# Patient Record
Sex: Female | Born: 1945 | Race: White | Hispanic: No | Smoking: Former smoker
Health system: Southern US, Community
[De-identification: ages and names within clinical notes are randomized; demographics above are authoritative.]

## PROBLEM LIST (undated history)

## (undated) DIAGNOSIS — T4145XA Adverse effect of unspecified anesthetic, initial encounter: Secondary | ICD-10-CM

## (undated) DIAGNOSIS — F419 Anxiety disorder, unspecified: Secondary | ICD-10-CM

## (undated) DIAGNOSIS — M8580 Other specified disorders of bone density and structure, unspecified site: Secondary | ICD-10-CM

## (undated) DIAGNOSIS — K649 Unspecified hemorrhoids: Secondary | ICD-10-CM

## (undated) DIAGNOSIS — F99 Mental disorder, not otherwise specified: Secondary | ICD-10-CM

## (undated) DIAGNOSIS — T8859XA Other complications of anesthesia, initial encounter: Secondary | ICD-10-CM

## (undated) DIAGNOSIS — D259 Leiomyoma of uterus, unspecified: Secondary | ICD-10-CM

## (undated) DIAGNOSIS — M1990 Unspecified osteoarthritis, unspecified site: Secondary | ICD-10-CM

## (undated) DIAGNOSIS — R112 Nausea with vomiting, unspecified: Secondary | ICD-10-CM

## (undated) DIAGNOSIS — N289 Disorder of kidney and ureter, unspecified: Secondary | ICD-10-CM

## (undated) DIAGNOSIS — Z9889 Other specified postprocedural states: Secondary | ICD-10-CM

## (undated) DIAGNOSIS — N6019 Diffuse cystic mastopathy of unspecified breast: Secondary | ICD-10-CM

## (undated) HISTORY — DX: Anxiety disorder, unspecified: F41.9

## (undated) HISTORY — DX: Other specified disorders of bone density and structure, unspecified site: M85.80

## (undated) HISTORY — DX: Disorder of kidney and ureter, unspecified: N28.9

## (undated) HISTORY — DX: Diffuse cystic mastopathy of unspecified breast: N60.19

## (undated) HISTORY — DX: Leiomyoma of uterus, unspecified: D25.9

## (undated) HISTORY — PX: TONSILLECTOMY: SUR1361

## (undated) HISTORY — PX: BREAST BIOPSY: SHX20

---

## 1998-02-18 ENCOUNTER — Other Ambulatory Visit: Admission: RE | Admit: 1998-02-18 | Discharge: 1998-02-18 | Payer: Self-pay | Admitting: Family Medicine

## 1998-06-07 ENCOUNTER — Emergency Department (HOSPITAL_COMMUNITY): Admission: EM | Admit: 1998-06-07 | Discharge: 1998-06-07 | Payer: Self-pay | Admitting: Endocrinology

## 2000-08-15 ENCOUNTER — Encounter: Admission: RE | Admit: 2000-08-15 | Discharge: 2000-08-15 | Payer: Self-pay | Admitting: Family Medicine

## 2000-08-15 ENCOUNTER — Encounter: Payer: Self-pay | Admitting: Family Medicine

## 2001-06-05 ENCOUNTER — Other Ambulatory Visit: Admission: RE | Admit: 2001-06-05 | Discharge: 2001-06-05 | Payer: Self-pay | Admitting: Family Medicine

## 2001-08-24 ENCOUNTER — Encounter: Admission: RE | Admit: 2001-08-24 | Discharge: 2001-08-24 | Payer: Self-pay | Admitting: Family Medicine

## 2001-08-24 ENCOUNTER — Encounter: Payer: Self-pay | Admitting: Family Medicine

## 2002-09-13 ENCOUNTER — Encounter: Admission: RE | Admit: 2002-09-13 | Discharge: 2002-09-13 | Payer: Self-pay | Admitting: Family Medicine

## 2002-09-13 ENCOUNTER — Encounter: Payer: Self-pay | Admitting: Family Medicine

## 2002-09-27 ENCOUNTER — Ambulatory Visit (HOSPITAL_COMMUNITY): Admission: RE | Admit: 2002-09-27 | Discharge: 2002-09-27 | Payer: Self-pay | Admitting: Gastroenterology

## 2002-09-28 ENCOUNTER — Encounter: Admission: RE | Admit: 2002-09-28 | Discharge: 2002-09-28 | Payer: Self-pay | Admitting: Family Medicine

## 2002-09-28 ENCOUNTER — Encounter: Payer: Self-pay | Admitting: Family Medicine

## 2002-10-09 ENCOUNTER — Encounter: Payer: Self-pay | Admitting: Nephrology

## 2002-10-09 ENCOUNTER — Encounter: Admission: RE | Admit: 2002-10-09 | Discharge: 2002-10-09 | Payer: Self-pay | Admitting: Nephrology

## 2002-11-30 ENCOUNTER — Encounter: Payer: Self-pay | Admitting: Nephrology

## 2002-11-30 ENCOUNTER — Ambulatory Visit (HOSPITAL_COMMUNITY): Admission: RE | Admit: 2002-11-30 | Discharge: 2002-11-30 | Payer: Self-pay | Admitting: Nephrology

## 2002-12-09 HISTORY — PX: NEPHRECTOMY: SHX65

## 2003-11-28 ENCOUNTER — Encounter: Admission: RE | Admit: 2003-11-28 | Discharge: 2003-11-28 | Payer: Self-pay | Admitting: Family Medicine

## 2004-01-10 ENCOUNTER — Other Ambulatory Visit: Admission: RE | Admit: 2004-01-10 | Discharge: 2004-01-10 | Payer: Self-pay | Admitting: Family Medicine

## 2004-03-16 ENCOUNTER — Ambulatory Visit (HOSPITAL_BASED_OUTPATIENT_CLINIC_OR_DEPARTMENT_OTHER): Admission: RE | Admit: 2004-03-16 | Discharge: 2004-03-16 | Payer: Self-pay | Admitting: Surgery

## 2004-03-16 HISTORY — PX: INCISIONAL HERNIA REPAIR: SHX193

## 2004-10-08 HISTORY — PX: CHEILECTOMY: SHX1336

## 2004-10-28 ENCOUNTER — Ambulatory Visit (HOSPITAL_BASED_OUTPATIENT_CLINIC_OR_DEPARTMENT_OTHER): Admission: RE | Admit: 2004-10-28 | Discharge: 2004-10-28 | Payer: Self-pay | Admitting: Orthopedic Surgery

## 2004-10-28 ENCOUNTER — Encounter (INDEPENDENT_AMBULATORY_CARE_PROVIDER_SITE_OTHER): Payer: Self-pay | Admitting: Specialist

## 2004-10-28 ENCOUNTER — Ambulatory Visit (HOSPITAL_COMMUNITY): Admission: RE | Admit: 2004-10-28 | Discharge: 2004-10-28 | Payer: Self-pay | Admitting: Orthopedic Surgery

## 2004-11-30 ENCOUNTER — Ambulatory Visit (HOSPITAL_COMMUNITY): Admission: RE | Admit: 2004-11-30 | Discharge: 2004-11-30 | Payer: Self-pay | Admitting: Family Medicine

## 2004-12-02 ENCOUNTER — Encounter: Admission: RE | Admit: 2004-12-02 | Discharge: 2004-12-02 | Payer: Self-pay | Admitting: Family Medicine

## 2005-05-13 ENCOUNTER — Other Ambulatory Visit: Admission: RE | Admit: 2005-05-13 | Discharge: 2005-05-13 | Payer: Self-pay | Admitting: Family Medicine

## 2005-12-03 ENCOUNTER — Encounter: Admission: RE | Admit: 2005-12-03 | Discharge: 2005-12-03 | Payer: Self-pay | Admitting: Family Medicine

## 2006-05-10 ENCOUNTER — Other Ambulatory Visit: Admission: RE | Admit: 2006-05-10 | Discharge: 2006-05-10 | Payer: Self-pay | Admitting: Family Medicine

## 2006-09-16 ENCOUNTER — Encounter: Admission: RE | Admit: 2006-09-16 | Discharge: 2006-09-16 | Payer: Self-pay | Admitting: Family Medicine

## 2006-12-21 ENCOUNTER — Ambulatory Visit (HOSPITAL_COMMUNITY): Admission: RE | Admit: 2006-12-21 | Discharge: 2006-12-21 | Payer: Self-pay | Admitting: Family Medicine

## 2007-06-01 ENCOUNTER — Other Ambulatory Visit: Admission: RE | Admit: 2007-06-01 | Discharge: 2007-06-01 | Payer: Self-pay | Admitting: Family Medicine

## 2007-12-25 ENCOUNTER — Ambulatory Visit (HOSPITAL_COMMUNITY): Admission: RE | Admit: 2007-12-25 | Discharge: 2007-12-25 | Payer: Self-pay | Admitting: Family Medicine

## 2008-06-18 ENCOUNTER — Other Ambulatory Visit: Admission: RE | Admit: 2008-06-18 | Discharge: 2008-06-18 | Payer: Self-pay | Admitting: Family Medicine

## 2008-12-25 ENCOUNTER — Ambulatory Visit (HOSPITAL_COMMUNITY): Admission: RE | Admit: 2008-12-25 | Discharge: 2008-12-25 | Payer: Self-pay | Admitting: Family Medicine

## 2009-12-30 ENCOUNTER — Ambulatory Visit (HOSPITAL_COMMUNITY): Admission: RE | Admit: 2009-12-30 | Discharge: 2009-12-30 | Payer: Self-pay | Admitting: Family Medicine

## 2010-08-06 ENCOUNTER — Other Ambulatory Visit: Admission: RE | Admit: 2010-08-06 | Discharge: 2010-08-06 | Payer: Self-pay | Admitting: Family Medicine

## 2010-11-08 HISTORY — PX: INCISIONAL HERNIA REPAIR: SHX193

## 2010-11-08 HISTORY — PX: LIPOSUCTION: SHX10

## 2010-11-26 ENCOUNTER — Other Ambulatory Visit (HOSPITAL_COMMUNITY): Payer: Self-pay | Admitting: Obstetrics and Gynecology

## 2010-11-26 DIAGNOSIS — Z1231 Encounter for screening mammogram for malignant neoplasm of breast: Secondary | ICD-10-CM

## 2010-11-26 DIAGNOSIS — Z0000 Encounter for general adult medical examination without abnormal findings: Secondary | ICD-10-CM

## 2010-11-29 ENCOUNTER — Encounter: Payer: Self-pay | Admitting: Family Medicine

## 2010-12-31 ENCOUNTER — Ambulatory Visit (HOSPITAL_COMMUNITY): Admission: RE | Admit: 2010-12-31 | Payer: Self-pay | Source: Ambulatory Visit

## 2011-01-06 ENCOUNTER — Ambulatory Visit (HOSPITAL_COMMUNITY)
Admission: RE | Admit: 2011-01-06 | Discharge: 2011-01-06 | Disposition: A | Discharge status: Home / Self Care | Payer: BC Managed Care – PPO | Source: Ambulatory Visit | Attending: Obstetrics and Gynecology | Admitting: Obstetrics and Gynecology

## 2011-01-06 DIAGNOSIS — Z1231 Encounter for screening mammogram for malignant neoplasm of breast: Secondary | ICD-10-CM

## 2011-03-01 DIAGNOSIS — K432 Incisional hernia without obstruction or gangrene: Secondary | ICD-10-CM | POA: Insufficient documentation

## 2011-03-26 NOTE — Op Note (Signed)
Bianca Pollard, Bianca Pollard NO.:  [id number]   MEDICAL RECORD NO.:  [id number]          PATIENT TYPE:  AMB   LOCATION:  DSC                          FACILITY:  [facility]   PHYSICIAN:  Leonides Grills, M.D.     DATE OF BIRTH:  [birth year]   DATE OF PROCEDURE:  10/28/2004  DATE OF DISCHARGE:                                 OPERATIVE REPORT   PREOPERATIVE DIAGNOSIS:  Left hallux rigidus.   POSTOPERATIVE DIAGNOSIS:  Left hallux rigidus.   OPERATION PERFORMED:  Left great toe cheilectomy.   SURGEON:  Leonides Grills, M.D.   ASSISTANT:  Lianne Cure, P.A.   ANESTHESIA:  General endotracheal tube with ankle block.   ESTIMATED BLOOD LOSS:  Minimal.   TOURNIQUET TIME:  Approximately half hour.   COMPLICATIONS:  None.   DISPOSITION:  Stable to PR.   INDICATIONS FOR PROCEDURE:  The patient is a 65 year old female who has had  progressive longstanding left great toe pain that was interfering with her  life to the point where she can not do what she wants to do.  She has  consented for the above procedure.  All risks which include infection,  neurovascular injury, stiffness, persistent arthritis, prolonged recovery  and possible future fusion were all explained, questions were encouraged and  answered.   DESCRIPTION OF PROCEDURE:  The patient was brought to the operating room and  ankle block was administered as well as Ancef 1 g IV piggyback.  The left  lower extremity was then prepped and draped in sterile manner over a  proximally placed thigh tourniquet.  Limb was gravity exsanguinated,  tourniquet was elevated to 290 mmHg.  Longitudinal incision was then made  over the great toe MTP joint.  Dissection was carried down through the skin.  Hemostasis was then obtained.  EHL was then retracted out of harm's way.  Longitudinal capsulotomy was then created dorsally.  Spur was then dissected  out both medially and laterally and this was then removed with a sagittal  saw.  Spurs  in both the medial and lateral gutter were removed as well with  a rongeur.  Spurs were also removed from the dorsal aspect base of proximal  phalanx.  Area was copiously irrigated with normal saline.  Bone  wax was applied to bone and exposed bony surfaces.  Area was copiously  irrigated with normal saline.  Capsule was closed with 3-0 Vicryl.  The skin  was closed with 4-0 nylon. Sterile dressing was applied.  Hard sole shoe was  applied.  The patient was stable to the PAR.      Paul   PB/MEDQ  D:  10/28/2004  T:  10/29/2004  Job:  [id number]

## 2011-03-26 NOTE — Op Note (Signed)
   NAME:  Bianca Pollard, Bianca Pollard                      ACCOUNT NO.:  [id number]   MEDICAL RECORD NO.:  [id number]                   PATIENT TYPE:  AMB   LOCATION:  ENDO                                 FACILITY:  [facility]   PHYSICIAN:  James L. Malon Kindle., M.D.          DATE OF BIRTH:  09/27/1946   DATE OF PROCEDURE:  09/27/2002  DATE OF DISCHARGE:                                 OPERATIVE REPORT   PROCEDURE PERFORMED:  Colonoscopy.   ENDOSCOPIST:  Llana Aliment. Edwards, M.D.   MEDICATIONS:  Fentanyl 50 mcg, Versed 5 mg IV.   INSTRUMENT USED:  Pediatric Olympus video colonoscope.   INDICATIONS FOR PROCEDURE:  Colon cancer screening.   DESCRIPTION OF PROCEDURE:  The procedure had been explained to the patient  and consent obtained.  With the patient in the left lateral decubitus  position, the pediatric Olympus video colonoscope was inserted and advanced  under direct visualization.  The prep was excellent and we were able to  reach the cecum without difficulty. The ileocecal valve and appendiceal  orifice were seen.  The scope was withdrawn and the cecum, ascending colon,  hepatic flexure, transverse colon, splenic flexure, descending and sigmoid  colon were seen well upon removal.  No polyps seen and no significant  diverticulosis.  The scope was withdrawn.  The patient tolerated the  procedure well.   ASSESSMENT:  Essentially normal colonoscopy.   PLAN:  Yearly Hemoccults.  Would recommend possibly repeating in 10 years.                                               James L. Malon Kindle., M.D.    Waldron Session  D:  09/27/2002  T:  09/27/2002  Job:  [id number]   cc:   Jethro Bastos, M.D.  [address]  [place]  [place] [address]  Fax: [phone]

## 2011-03-26 NOTE — Op Note (Signed)
Bianca Pollard, Bianca Pollard                      ACCOUNT NO.:  [id number]   MEDICAL RECORD NO.:  [id number]                   PATIENT TYPE:  AMB   LOCATION:  NESC                                 FACILITY:  [facility]   PHYSICIAN:  Currie Paris, M.D.           DATE OF BIRTH:  01-20-1946   DATE OF PROCEDURE:  03/16/2004  DATE OF DISCHARGE:                                 OPERATIVE REPORT   CCS#:  [id number]   PREOPERATIVE DIAGNOSES:  Epigastric incisional hernia.   POSTOPERATIVE DIAGNOSES:  Epigastric incisional hernia.   OPERATION:  Repair of epigastric incisional hernia with mesh.   SURGEON:  Currie Paris, M.D.   ANESTHESIA:  MAC.   CLINICAL COURSE:  This patient is a 65 year old who is status post  laparoscopic kidney donation who developed a small epigastric hernia in a  midline trocar site.  It was reducible and intermittently got fairly large.   DESCRIPTION OF PROCEDURE:  The patient was seen in the holding area and had  no further questions. The operative site was marked and confirmed by the  patient. She was taken to the operating room and given IV sedation. The area  was prepped and draped. Using a combination of 1% Xylocaine with epinephrine  mixed equally with 0.5% plain Marcaine for local and infiltrated the area.  The old scar was opened and subcutaneous tissues divided and the small  hernia sac identified and cleaned off and then reduced. The defect itself  was just under centimeter and just contained some preperitoneal fat. As I  was cleaning up the fat around this, I found a second small defect just  inferior to the first and this one only really measured 2-3 mm but did have  preperitoneal fat protruding through it as well.  I cleaned off the fascia  and then made sure both reduced easily. The small hernia was closed with a  single suture of 2-0 Prolene using a small piece of mesh as a plug to  reinforce the suture. The second defect was then closed with two  sutures of  2-0 Prolene again using a mesh plug. Everything appeared to be dry and there  was no tension closing these defects.   The subcu was closed with some 2-0 Vicryl and skin with a 4-0 Monocryl  subcuticular plus Dermabond.  The patient tolerated the procedure well.  There were no operative complications.  All counts were correct.                                               Currie Paris, M.D.    CJS/MEDQ  D:  03/16/2004  T:  03/16/2004  Job:  [id number]   cc:   Jethro Bastos, M.D.  [address]  [place]  [place] [address]  Fax: [phone]  Dyke Maes, M.D.  [address]  [place]  [place] [address]  Fax: [phone]

## 2011-09-08 ENCOUNTER — Ambulatory Visit
Admission: RE | Admit: 2011-09-08 | Discharge: 2011-09-08 | Disposition: A | Discharge status: Home / Self Care | Payer: BC Managed Care – PPO | Source: Ambulatory Visit | Attending: Family Medicine | Admitting: Family Medicine

## 2011-09-08 ENCOUNTER — Other Ambulatory Visit: Payer: Self-pay | Admitting: Family Medicine

## 2011-11-30 ENCOUNTER — Other Ambulatory Visit (HOSPITAL_COMMUNITY): Payer: Self-pay | Admitting: Family Medicine

## 2011-11-30 DIAGNOSIS — Z1231 Encounter for screening mammogram for malignant neoplasm of breast: Secondary | ICD-10-CM

## 2012-01-18 ENCOUNTER — Ambulatory Visit (HOSPITAL_COMMUNITY): Payer: BC Managed Care – PPO

## 2012-02-08 ENCOUNTER — Ambulatory Visit (HOSPITAL_COMMUNITY): Payer: BC Managed Care – PPO

## 2012-03-01 ENCOUNTER — Ambulatory Visit (HOSPITAL_COMMUNITY): Payer: BC Managed Care – PPO

## 2012-03-16 ENCOUNTER — Ambulatory Visit (HOSPITAL_COMMUNITY)
Admission: RE | Admit: 2012-03-16 | Discharge: 2012-03-16 | Disposition: A | Discharge status: Home / Self Care | Payer: BC Managed Care – PPO | Source: Ambulatory Visit | Attending: Family Medicine | Admitting: Family Medicine

## 2012-03-16 DIAGNOSIS — Z1231 Encounter for screening mammogram for malignant neoplasm of breast: Secondary | ICD-10-CM

## 2012-03-21 ENCOUNTER — Other Ambulatory Visit: Payer: Self-pay | Admitting: Family Medicine

## 2012-03-21 DIAGNOSIS — R928 Other abnormal and inconclusive findings on diagnostic imaging of breast: Secondary | ICD-10-CM

## 2012-03-24 ENCOUNTER — Ambulatory Visit
Admission: RE | Admit: 2012-03-24 | Discharge: 2012-03-24 | Disposition: A | Discharge status: Home / Self Care | Payer: BC Managed Care – PPO | Source: Ambulatory Visit | Attending: Family Medicine | Admitting: Family Medicine

## 2012-03-24 DIAGNOSIS — R928 Other abnormal and inconclusive findings on diagnostic imaging of breast: Secondary | ICD-10-CM

## 2012-08-21 ENCOUNTER — Other Ambulatory Visit: Payer: Self-pay | Admitting: Family Medicine

## 2012-08-21 DIAGNOSIS — N6009 Solitary cyst of unspecified breast: Secondary | ICD-10-CM

## 2012-09-13 ENCOUNTER — Ambulatory Visit
Admission: RE | Admit: 2012-09-13 | Discharge: 2012-09-13 | Disposition: A | Discharge status: Home / Self Care | Payer: BC Managed Care – PPO | Source: Ambulatory Visit | Attending: Family Medicine | Admitting: Family Medicine

## 2012-09-13 DIAGNOSIS — N6009 Solitary cyst of unspecified breast: Secondary | ICD-10-CM

## 2013-02-22 ENCOUNTER — Encounter (INDEPENDENT_AMBULATORY_CARE_PROVIDER_SITE_OTHER): Payer: Self-pay | Admitting: General Surgery

## 2013-02-26 ENCOUNTER — Ambulatory Visit
Admission: RE | Admit: 2013-02-26 | Discharge: 2013-02-26 | Disposition: A | Discharge status: Home / Self Care | Payer: Medicare Other | Source: Ambulatory Visit | Attending: Family Medicine | Admitting: Family Medicine

## 2013-02-26 ENCOUNTER — Other Ambulatory Visit: Payer: Self-pay | Admitting: Family Medicine

## 2013-02-26 DIAGNOSIS — M545 Low back pain: Secondary | ICD-10-CM

## 2013-02-26 DIAGNOSIS — M25551 Pain in right hip: Secondary | ICD-10-CM

## 2013-02-27 ENCOUNTER — Ambulatory Visit (INDEPENDENT_AMBULATORY_CARE_PROVIDER_SITE_OTHER): Payer: Self-pay | Admitting: Surgery

## 2013-02-28 ENCOUNTER — Encounter (INDEPENDENT_AMBULATORY_CARE_PROVIDER_SITE_OTHER): Payer: Self-pay | Admitting: Surgery

## 2013-02-28 ENCOUNTER — Ambulatory Visit (INDEPENDENT_AMBULATORY_CARE_PROVIDER_SITE_OTHER): Payer: Medicare Other | Admitting: Surgery

## 2013-02-28 VITALS — BP 138/68 | HR 76 | Temp 97.4°F | Resp 18 | Ht 64.0 in | Wt 139.4 lb

## 2013-02-28 DIAGNOSIS — K432 Incisional hernia without obstruction or gangrene: Secondary | ICD-10-CM

## 2013-02-28 NOTE — Patient Instructions (Signed)
We will schedule a CT scan of your abdomen to evaluate the extent of the incisional hernia. After that is done and after you have seen your physician about your sciatica we can make a decision about surgery.

## 2013-02-28 NOTE — Progress Notes (Signed)
[name] DOB: 1946-07-23 MRN: [id number]                                                                                      DATE: 02/28/2013  PCP: Bianca Pollard Referring Provider: Carilyn Goodpasture, PA-C  IMPRESSION:  Recurrent incisional hernia  PLAN:   I think the next step is to get a CT scan to be sure there are not multiple defects not apparent on physical examination. In addition, she has a consultation with Saturday with physician to discuss her sciatica. I think this needs to be evaluated prior to making any surgical plans as we will need to have her able to ambulate well following a surgery. She may be a candidate now for a laparoscopic rather than open repair but the scan should help [place] decide that.                 CC:  Chief Complaint  Patient presents with  . Incisional Hernia    eval recurrent incisional hernia    HPI:  Bianca Pollard is a 67 y.o.  female who presents for evaluation of recurrent incisional hernia. 2005 I repaired a small epigastric incisional hernia that was at a laparoscopic surgical trocar site. She did well from that surgery. About 2 years ago she had  Liposuction surgery. She apparently then developed another hernia but were marginal repair was is not clear why that happened. At any rate, the surgeon who had done  Liposuction surgery apparently did a repair although he do not have an operative note on that. However she apparently continues to have a bulge in the area and is occasionally tender. She is not having any other GI symptoms. She was sent for evaluation for possible repair.  PMH:  has a past medical history of Fibroid uterus; Fibrocystic breast disease; Osteopenia; Anxiety; and Renal insufficiency.  PSH:   has past surgical history that includes Cesarean section (1980); Tonsillectomy; Breast biopsy; Incisional hernia repair (03/16/2004); Nephrectomy (12/2002); Cheilectomy (10/2004); Incisional hernia repair (2012); and  Liposuction (2012).  ALLERGIES:   Allergies  Allergen Reactions  . Nsaids     One kidney  . Sulfa Antibiotics     MEDICATIONS: Current outpatient prescriptions:aspirin 81 MG tablet, Take 81 mg by mouth daily., Disp: , Rfl: ;  benzonatate (TESSALON) 200 MG capsule, , Disp: , Rfl: ;  Calcium Carbonate-Vitamin D (CALCIUM + D PO), Take by mouth daily., Disp: , Rfl: ;  cetirizine (ZYRTEC) 10 MG tablet, Take 10 mg by mouth daily., Disp: , Rfl: ;  fluticasone (FLONASE) 50 MCG/ACT nasal spray, , Disp: , Rfl:  traZODone (DESYREL) 150 MG tablet, Take 150 mg by mouth at bedtime., Disp: , Rfl:   ROS: She has filled out our 12 point review of systems and it is negative except for significant sciatica. She has orthopedic surgical consultation about that scheduled in a few days.Marland Kitchen EXAM:   Vital signs:BP 138/68  Pulse 76  Temp(Src) 97.4 F (36.3 C) (Temporal)  Resp 18  Ht 5\' 4"  (1.626 m)  Wt 139 lb 6.4 oz (63.231 kg)  BMI 23.92 kg/m2 General: Patient alert oriented generally healthy-appearing  but slow to move as she is having significant pain from her sciatica. Abdomen: She has a moderately long upper midline incision. At the lower end is a bulge which I think is consistent with a hernia the partially reduces. I can't really feel the defect. The remainder of her abdomen is completely benign and nontender. There are no other masses. There is no hepatosplenomegaly.  DATA REVIEWED:  I reviewed over my prior office notes    Bianca Pollard J 02/28/2013  CC: Bianca Goodpasture, PA-C, [place], PA-C

## 2013-03-01 ENCOUNTER — Ambulatory Visit
Admission: RE | Admit: 2013-03-01 | Discharge: 2013-03-01 | Disposition: A | Discharge status: Home / Self Care | Payer: Medicare Other | Source: Ambulatory Visit | Attending: Surgery | Admitting: Surgery

## 2013-03-01 DIAGNOSIS — K432 Incisional hernia without obstruction or gangrene: Secondary | ICD-10-CM

## 2013-03-01 MED ORDER — IOHEXOL 300 MG/ML  SOLN
75.0000 mL | Freq: Once | INTRAMUSCULAR | Status: AC | PRN
  Administered 2013-03-01: 75 mL via INTRAVENOUS

## 2013-03-06 ENCOUNTER — Telehealth (INDEPENDENT_AMBULATORY_CARE_PROVIDER_SITE_OTHER): Payer: Self-pay | Admitting: General Surgery

## 2013-03-06 ENCOUNTER — Other Ambulatory Visit (INDEPENDENT_AMBULATORY_CARE_PROVIDER_SITE_OTHER): Payer: Self-pay | Admitting: Surgery

## 2013-03-06 NOTE — Telephone Encounter (Signed)
Message copied by Liliana Cline on Tue Mar 06, 2013  4:08 PM ------      Message from: Zacarias Pontes      Created: Tue Mar 06, 2013  3:26 PM      Contact: [phone]       Pt going to therapy they told her no need to wait for hernia sx,she is calling to let doc know...please call pt back for next step in process.  ------

## 2013-03-06 NOTE — Telephone Encounter (Signed)
Spoke with Dr Jamey Ripa and he stated it was okay to go ahead with hernia repair. Patient made aware. Orders written and taken to scheduling.

## 2013-03-07 ENCOUNTER — Telehealth (INDEPENDENT_AMBULATORY_CARE_PROVIDER_SITE_OTHER): Payer: Self-pay | Admitting: General Surgery

## 2013-03-07 NOTE — Telephone Encounter (Signed)
Patient had called in to was talking to [facility] in scheduling and she wanted to know if she could drink 4 days after surgery, I told her that she can not drive if she is taking any pain med. And she stated that she could do that but she needed to drive 1 hour and 20 min to her mother who needed her to be there. I also told her that she needed to drink a lot of water and she needed to get out and walk around for about 20 min when she go up to her mother and she stated that she will do that

## 2013-03-12 ENCOUNTER — Encounter (HOSPITAL_COMMUNITY): Payer: Self-pay | Admitting: Pharmacy Technician

## 2013-03-15 ENCOUNTER — Encounter (INDEPENDENT_AMBULATORY_CARE_PROVIDER_SITE_OTHER): Payer: Self-pay

## 2013-03-15 ENCOUNTER — Encounter (HOSPITAL_COMMUNITY)
Admission: RE | Admit: 2013-03-15 | Discharge: 2013-03-15 | Disposition: A | Discharge status: Home / Self Care | Payer: Medicare Other | Source: Ambulatory Visit | Attending: Surgery | Admitting: Surgery

## 2013-03-15 NOTE — Pre-Procedure Instructions (Signed)
Bianca Pollard  03/15/2013   Your procedure is scheduled on:  03/19/13  Report to [facility] at 10 AM.  Call this number if you have problems the morning of surgery: [phone]   Remember:   Do not eat food or drink liquids after midnight.   Take these medicines the morning of surgery with A SIP OF WATER: zyrtec,flonase   Do not wear jewelry, make-up or nail polish.  Do not wear lotions, powders, or perfumes. You may wear deodorant.  Do not shave 48 hours prior to surgery. Men may shave face and neck.  Do not bring valuables to the hospital.  Contacts, dentures or bridgework may not be worn into surgery.  Leave suitcase in the car. After surgery it may be brought to your room.  For patients admitted to the hospital, checkout time is 11:00 AM the day of  discharge.   Patients discharged the day of surgery will not be allowed to drive  home.  Name and phone number of your driver: family  Special Instructions: Incentive Spirometry - Practice and bring it with you on the day of surgery.   Please read over the following fact sheets that you were given: Pain Booklet, Coughing and Deep Breathing, MRSA Information and Surgical Site Infection Prevention

## 2013-04-04 ENCOUNTER — Encounter (HOSPITAL_COMMUNITY): Payer: Self-pay

## 2013-04-04 ENCOUNTER — Other Ambulatory Visit: Payer: Self-pay

## 2013-04-04 ENCOUNTER — Encounter (HOSPITAL_COMMUNITY)
Admission: RE | Admit: 2013-04-04 | Discharge: 2013-04-04 | Disposition: A | Discharge status: Home / Self Care | Payer: Medicare Other | Source: Ambulatory Visit | Attending: Surgery | Admitting: Surgery

## 2013-04-04 DIAGNOSIS — Z1231 Encounter for screening mammogram for malignant neoplasm of breast: Secondary | ICD-10-CM

## 2013-04-04 HISTORY — DX: Mental disorder, not otherwise specified: F99

## 2013-04-04 HISTORY — DX: Unspecified hemorrhoids: K64.9

## 2013-04-04 HISTORY — DX: Unspecified osteoarthritis, unspecified site: M19.90

## 2013-04-04 LAB — CBC
HCT: 39.3 % (ref 36.0–46.0)
Hemoglobin: 13.4 g/dL (ref 12.0–15.0)
RBC: 4.5 MIL/uL (ref 3.87–5.11)

## 2013-04-04 LAB — SURGICAL PCR SCREEN
MRSA, PCR: NEGATIVE
Staphylococcus aureus: POSITIVE — AB

## 2013-04-04 NOTE — Pre-Procedure Instructions (Signed)
JARROD BODKINS  04/04/2013   Your procedure is scheduled on:  Monday, June 2nd.  Report to [facility] at 10:00AM.  Call this number if you have problems the morning of surgery: [phone]   Remember:   Do not eat food or drink liquids after midnight.   Take these medicines the morning of surgery with A SIP OF WATER: cetirizine (ZYRTEC)   Do not wear jewelry, make-up or nail polish.  Do not wear lotions, powders, or perfumes. You may wear deodorant.  Do not shave 48 hours prior to surgery. Do not bring valuables to the hospital.  Contacts, dentures or bridgework may not be worn into surgery.  Leave suitcase in the car. After surgery it may be brought to your room.  For patients admitted to the hospital, checkout time is 11:00 AM the day of discharge.   Patients discharged the day of surgery will not be allowed to drive home.  Name and phone number of your driver: -    Special Instructions: Shower using CHG 2 nights before surgery and the night before surgery.  If you shower the day of surgery use CHG.  Use special wash - you have one bottle of CHG for all showers.  You should use approximately 1/3 of the bottle for each shower.   Please read over the following fact sheets that you were given: Pain Booklet, Coughing and Deep Breathing and Surgical Site Infection Prevention

## 2013-04-06 ENCOUNTER — Encounter (INDEPENDENT_AMBULATORY_CARE_PROVIDER_SITE_OTHER): Payer: Medicare Other | Admitting: Surgery

## 2013-04-06 NOTE — Progress Notes (Signed)
Message left for pt. Concerning time change. Patient to arrive at 9:30 AM on 04-09-2013. Message left for pt. To call and let us know if they received the message.

## 2013-04-08 MED ORDER — CEFAZOLIN SODIUM-DEXTROSE 2-3 GM-% IV SOLR
2.0000 g | INTRAVENOUS | Status: AC
Start: 2013-04-09 — End: 2013-04-09
  Administered 2013-04-09: 2 g via INTRAVENOUS
  Filled 2013-04-08: qty 50

## 2013-04-09 ENCOUNTER — Ambulatory Visit (HOSPITAL_COMMUNITY)
Admission: RE | Admit: 2013-04-09 | Discharge: 2013-04-09 | Disposition: A | Discharge status: Home / Self Care | Payer: Medicare Other | Source: Ambulatory Visit | Attending: Surgery | Admitting: Surgery

## 2013-04-09 ENCOUNTER — Ambulatory Visit (HOSPITAL_COMMUNITY): Payer: Medicare Other | Admitting: Registered Nurse

## 2013-04-09 ENCOUNTER — Encounter (HOSPITAL_COMMUNITY): Payer: Self-pay | Admitting: *Deleted

## 2013-04-09 ENCOUNTER — Encounter (HOSPITAL_COMMUNITY)
Admission: RE | Disposition: A | Discharge status: Home / Self Care | Payer: Self-pay | Source: Ambulatory Visit | Attending: Surgery

## 2013-04-09 ENCOUNTER — Encounter (HOSPITAL_COMMUNITY): Payer: Self-pay | Admitting: Registered Nurse

## 2013-04-09 DIAGNOSIS — F411 Generalized anxiety disorder: Secondary | ICD-10-CM | POA: Insufficient documentation

## 2013-04-09 DIAGNOSIS — K432 Incisional hernia without obstruction or gangrene: Secondary | ICD-10-CM | POA: Insufficient documentation

## 2013-04-09 DIAGNOSIS — M949 Disorder of cartilage, unspecified: Secondary | ICD-10-CM | POA: Insufficient documentation

## 2013-04-09 DIAGNOSIS — M899 Disorder of bone, unspecified: Secondary | ICD-10-CM | POA: Insufficient documentation

## 2013-04-09 DIAGNOSIS — Z882 Allergy status to sulfonamides status: Secondary | ICD-10-CM | POA: Insufficient documentation

## 2013-04-09 DIAGNOSIS — N289 Disorder of kidney and ureter, unspecified: Secondary | ICD-10-CM | POA: Insufficient documentation

## 2013-04-09 DIAGNOSIS — Z886 Allergy status to analgesic agent status: Secondary | ICD-10-CM | POA: Insufficient documentation

## 2013-04-09 DIAGNOSIS — M129 Arthropathy, unspecified: Secondary | ICD-10-CM | POA: Insufficient documentation

## 2013-04-09 DIAGNOSIS — N6019 Diffuse cystic mastopathy of unspecified breast: Secondary | ICD-10-CM | POA: Insufficient documentation

## 2013-04-09 DIAGNOSIS — D259 Leiomyoma of uterus, unspecified: Secondary | ICD-10-CM | POA: Insufficient documentation

## 2013-04-09 DIAGNOSIS — K439 Ventral hernia without obstruction or gangrene: Secondary | ICD-10-CM

## 2013-04-09 HISTORY — PX: VENTRAL HERNIA REPAIR: SHX424

## 2013-04-09 SURGERY — REPAIR, HERNIA, VENTRAL
Anesthesia: General | Site: Abdomen | Wound class: Clean

## 2013-04-09 MED ORDER — OXYCODONE HCL 5 MG/5ML PO SOLN: 5.0000 mg | Freq: Once | ORAL | Status: DC | PRN

## 2013-04-09 MED ORDER — ROCURONIUM BROMIDE 100 MG/10ML IV SOLN
INTRAVENOUS | Status: DC | PRN
  Administered 2013-04-09: 40 mg via INTRAVENOUS

## 2013-04-09 MED ORDER — NEOSTIGMINE METHYLSULFATE 1 MG/ML IJ SOLN
INTRAMUSCULAR | Status: DC | PRN
  Administered 2013-04-09: 3 mg via INTRAVENOUS

## 2013-04-09 MED ORDER — BUPIVACAINE HCL (PF) 0.25 % IJ SOLN
INTRAMUSCULAR | Status: AC
  Filled 2013-04-09: qty 30

## 2013-04-09 MED ORDER — 0.9 % SODIUM CHLORIDE (POUR BTL) OPTIME
TOPICAL | Status: DC | PRN
  Administered 2013-04-09: 1000 mL

## 2013-04-09 MED ORDER — GLYCOPYRROLATE 0.2 MG/ML IJ SOLN
INTRAMUSCULAR | Status: DC | PRN
  Administered 2013-04-09: 0.4 mg via INTRAVENOUS

## 2013-04-09 MED ORDER — DEXAMETHASONE SODIUM PHOSPHATE 10 MG/ML IJ SOLN
INTRAMUSCULAR | Status: DC | PRN
  Administered 2013-04-09: 4 mg via INTRAVENOUS

## 2013-04-09 MED ORDER — LACTATED RINGERS IV SOLN
INTRAVENOUS | Status: DC
  Administered 2013-04-09: 11:00:00 via INTRAVENOUS

## 2013-04-09 MED ORDER — ONDANSETRON HCL 4 MG/2ML IJ SOLN
INTRAMUSCULAR | Status: DC | PRN
  Administered 2013-04-09: 4 mg via INTRAVENOUS

## 2013-04-09 MED ORDER — LIDOCAINE HCL (CARDIAC) 20 MG/ML IV SOLN
INTRAVENOUS | Status: DC | PRN
  Administered 2013-04-09: 50 mg via INTRAVENOUS

## 2013-04-09 MED ORDER — CHLORHEXIDINE GLUCONATE 4 % EX LIQD: 1.0000 "application " | Freq: Once | CUTANEOUS | Status: DC

## 2013-04-09 MED ORDER — EPHEDRINE SULFATE 50 MG/ML IJ SOLN
INTRAMUSCULAR | Status: DC | PRN
  Administered 2013-04-09: 10 mg via INTRAVENOUS
  Administered 2013-04-09 (×3): 5 mg via INTRAVENOUS
  Administered 2013-04-09: 10 mg via INTRAVENOUS

## 2013-04-09 MED ORDER — HYDROMORPHONE HCL PF 1 MG/ML IJ SOLN
INTRAMUSCULAR | Status: AC
  Filled 2013-04-09: qty 1

## 2013-04-09 MED ORDER — OXYCODONE HCL 5 MG PO TABS: 5.0000 mg | ORAL_TABLET | Freq: Once | ORAL | Status: DC | PRN

## 2013-04-09 MED ORDER — HYDROCODONE-ACETAMINOPHEN 5-325 MG PO TABS: 1.0000 | ORAL_TABLET | ORAL | Status: DC | PRN

## 2013-04-09 MED ORDER — PROPOFOL 10 MG/ML IV BOLUS
INTRAVENOUS | Status: DC | PRN
  Administered 2013-04-09: 120 mg via INTRAVENOUS

## 2013-04-09 MED ORDER — FENTANYL CITRATE 0.05 MG/ML IJ SOLN
INTRAMUSCULAR | Status: DC | PRN
  Administered 2013-04-09: 100 ug via INTRAVENOUS
  Administered 2013-04-09: 50 ug via INTRAVENOUS

## 2013-04-09 MED ORDER — BUPIVACAINE HCL 0.25 % IJ SOLN
INTRAMUSCULAR | Status: DC | PRN
  Administered 2013-04-09: 20 mL

## 2013-04-09 MED ORDER — MIDAZOLAM HCL 5 MG/5ML IJ SOLN
INTRAMUSCULAR | Status: DC | PRN
  Administered 2013-04-09: 2 mg via INTRAVENOUS

## 2013-04-09 MED ORDER — HYDROMORPHONE HCL PF 1 MG/ML IJ SOLN
0.2500 mg | INTRAMUSCULAR | Status: DC | PRN
  Administered 2013-04-09 (×2): 0.5 mg via INTRAVENOUS

## 2013-04-09 SURGICAL SUPPLY — 40 items
BLADE SURG ROTATE 9660 (MISCELLANEOUS) IMPLANT
CANISTER SUCTION 2500CC (MISCELLANEOUS) ×2 IMPLANT
CHLORAPREP W/TINT 26ML (MISCELLANEOUS) ×2 IMPLANT
CLOTH BEACON ORANGE TIMEOUT ST (SAFETY) ×2 IMPLANT
COVER SURGICAL LIGHT HANDLE (MISCELLANEOUS) ×2 IMPLANT
DERMABOND ADHESIVE PROPEN (GAUZE/BANDAGES/DRESSINGS) ×1
DERMABOND ADVANCED .7 DNX6 (GAUZE/BANDAGES/DRESSINGS) ×1 IMPLANT
DRAPE LAPAROSCOPIC ABDOMINAL (DRAPES) ×2 IMPLANT
DRAPE UTILITY 15X26 W/TAPE STR (DRAPE) ×4 IMPLANT
ELECT CAUTERY BLADE 6.4 (BLADE) ×2 IMPLANT
ELECT REM PT RETURN 9FT ADLT (ELECTROSURGICAL) ×2
ELECTRODE REM PT RTRN 9FT ADLT (ELECTROSURGICAL) ×1 IMPLANT
GLOVE BIO SURGEON STRL SZ7.5 (GLOVE) ×2 IMPLANT
GLOVE BIOGEL PI IND STRL 7.0 (GLOVE) ×1 IMPLANT
GLOVE BIOGEL PI IND STRL 7.5 (GLOVE) ×2 IMPLANT
GLOVE BIOGEL PI INDICATOR 7.0 (GLOVE) ×1
GLOVE BIOGEL PI INDICATOR 7.5 (GLOVE) ×2
GLOVE EUDERMIC 7 POWDERFREE (GLOVE) ×2 IMPLANT
GOWN PREVENTION PLUS XLARGE (GOWN DISPOSABLE) ×2 IMPLANT
GOWN STRL NON-REIN LRG LVL3 (GOWN DISPOSABLE) ×4 IMPLANT
KIT BASIN OR (CUSTOM PROCEDURE TRAY) ×2 IMPLANT
KIT ROOM TURNOVER OR (KITS) ×2 IMPLANT
MESH HERNIA 3X6 (Mesh General) ×2 IMPLANT
NEEDLE HYPO 25GX1X1/2 BEV (NEEDLE) ×2 IMPLANT
NS IRRIG 1000ML POUR BTL (IV SOLUTION) ×2 IMPLANT
PACK GENERAL/GYN (CUSTOM PROCEDURE TRAY) ×2 IMPLANT
PAD ARMBOARD 7.5X6 YLW CONV (MISCELLANEOUS) ×4 IMPLANT
SPONGE GAUZE 4X4 12PLY (GAUZE/BANDAGES/DRESSINGS) IMPLANT
STAPLER VISISTAT 35W (STAPLE) IMPLANT
SUT MNCRL AB 4-0 PS2 18 (SUTURE) ×2 IMPLANT
SUT PROLENE 0 CT 1 CR/8 (SUTURE) ×4 IMPLANT
SUT SILK 2 0 (SUTURE)
SUT SILK 2-0 18XBRD TIE 12 (SUTURE) IMPLANT
SUT VIC AB 2-0 CT1 27 (SUTURE) ×1
SUT VIC AB 2-0 CT1 TAPERPNT 27 (SUTURE) ×1 IMPLANT
SUT VIC AB 3-0 SH 18 (SUTURE) ×2 IMPLANT
SYR CONTROL 10ML LL (SYRINGE) ×2 IMPLANT
TOWEL OR 17X24 6PK STRL BLUE (TOWEL DISPOSABLE) ×2 IMPLANT
TOWEL OR 17X26 10 PK STRL BLUE (TOWEL DISPOSABLE) ×2 IMPLANT
WATER STERILE IRR 1000ML POUR (IV SOLUTION) IMPLANT

## 2013-04-09 NOTE — Preoperative (Signed)
Beta Blockers   Reason not to administer Beta Blockers:Not Applicable 

## 2013-04-09 NOTE — Anesthesia Postprocedure Evaluation (Signed)
  Anesthesia Post-op Note  Patient: Bianca Pollard  Procedure(s) Performed: Procedure(s): REPAIR RECURRENT EPIGASTRIC HERNIA  with mesh (N/A)  Patient Location: PACU  Anesthesia Type:General  Level of Consciousness: awake and alert   Airway and Oxygen Therapy: Patient Spontanous Breathing and Patient connected to nasal cannula oxygen  Post-op Pain: mild  Post-op Assessment: Post-op Vital signs reviewed, Patient's Cardiovascular Status Stable, Respiratory Function Stable, Patent Airway and No signs of Nausea or vomiting  Post-op Vital Signs: Reviewed and stable  Complications: No apparent anesthesia complications

## 2013-04-09 NOTE — H&P (Signed)
HPI: This patient has a recurrent epigastric hernia and presents today for repair. Details are noted in my last progress note. After that visit a CT was done confirming the hernia. She requested that we proceed to repair.   PMH:  Past Medical History  Diagnosis Date  . Fibroid uterus   . Fibrocystic breast disease   . Osteopenia   . Anxiety   . Renal insufficiency   . Mental disorder   . Hemorrhoid   . Arthritis     thumb and wrist left   PSH:  Past Surgical History  Procedure Laterality Date  . Cesarean section  1980  . Tonsillectomy    . Incisional hernia repair  03/16/2004  . Nephrectomy  12/2002    donor  . Cheilectomy  10/2004    left great toe  . Incisional hernia repair  2012  . Liposuction  2012  . Breast biopsy Bilateral    Meds: Current Facility-Administered Medications  Medication Dose Route Frequency Provider Last Rate Last Dose  . ceFAZolin (ANCEF) IVPB 2 g/50 mL premix  2 g Intravenous On Call to OR Currie Paris, MD      . chlorhexidine (HIBICLENS) 4 % liquid 1 application  1 application Topical Once Currie Paris, MD      . Melene Muller ON 04/10/2013] chlorhexidine (HIBICLENS) 4 % liquid 1 application  1 application Topical Once Currie Paris, MD       Allergies: Nsaids and Sulfa antibiotics   PE: VITAL SIGNS:  BP 109/76  Pulse 56  Temp(Src) 97.1 F (36.2 C) (Oral)  Resp 16  SpO2 97%  GENERAL:  The patient is alert, oriented, and generally healthy-appearing, NAD. Mood and affect are normal.  HEENT:  The head is normocephalic, the eyes nonicteric, the pupils were round regular and equal. EOMs are normal. Pharynx normal. Dentition good.  NECK:  The neck is supple and there are no masses or thyromegaly.  LUNGS: Normal respirations and clear to auscultation.  HEART: Regular rhythm, with no murmurs rubs or gallops. Pulses are intact carotid dorsalis pedis and posterior tibial. No significant varicosities are noted.  ABDOMEN: Soft, flat,  and nontender. No masses or organomegaly is noted.There is an incisional hernia in the epigastrium noted and marked.  EXTREMITIES:  Good range of motion, no edema.  Imp: Recurrent Epigastric hernia  Plan: Repair with mesh. Discussed and reviewed with the patient and all questions answered

## 2013-04-09 NOTE — Anesthesia Procedure Notes (Signed)
Procedure Name: Intubation Date/Time: 04/09/2013 11:21 AM Performed by: Elon Alas Pre-anesthesia Checklist: Timeout performed, Patient identified, Emergency Drugs available, Suction available and Patient being monitored Patient Re-evaluated:Patient Re-evaluated prior to inductionOxygen Delivery Method: Circle system utilized Preoxygenation: Pre-oxygenation with 100% oxygen Intubation Type: IV induction Ventilation: Mask ventilation without difficulty Laryngoscope Size: 3 and Mac Grade View: Grade I Tube type: Oral Tube size: 7.5 mm Number of attempts: 1 Airway Equipment and Method: Stylet Placement Confirmation: positive ETCO2,  ETT inserted through vocal cords under direct vision and breath sounds checked- equal and bilateral Secured at: 21 cm Tube secured with: Tape Dental Injury: Teeth and Oropharynx as per pre-operative assessment

## 2013-04-09 NOTE — Anesthesia Preprocedure Evaluation (Addendum)
Anesthesia Evaluation  Patient identified by MRN, date of birth, ID band Patient awake    Reviewed: Allergy & Precautions, H&P , NPO status , Patient's Chart, lab work & pertinent test results  Airway Mallampati: I TM Distance: >3 FB Neck ROM: Full    Dental no notable dental hx. (+) Teeth Intact and Dental Advisory Given   Pulmonary neg pulmonary ROS,  breath sounds clear to auscultation  Pulmonary exam normal       Cardiovascular negative cardio ROS  Rhythm:Regular Rate:Normal     Neuro/Psych PSYCHIATRIC DISORDERS Anxiety negative neurological ROS     GI/Hepatic negative GI ROS, Neg liver ROS,   Endo/Other  negative endocrine ROS  Renal/GU negative Renal ROS  negative genitourinary   Musculoskeletal   Abdominal   Peds  Hematology negative hematology ROS (+)   Anesthesia Other Findings   Reproductive/Obstetrics negative OB ROS                          Anesthesia Physical Anesthesia Plan  ASA: I  Anesthesia Plan: General   Post-op Pain Management:    Induction: Intravenous  Airway Management Planned: Oral ETT  Additional Equipment:   Intra-op Plan:   Post-operative Plan: Extubation in OR  Informed Consent: I have reviewed the patients History and Physical, chart, labs and discussed the procedure including the risks, benefits and alternatives for the proposed anesthesia with the patient or authorized representative who has indicated his/her understanding and acceptance.   Dental advisory given  Plan Discussed with: Surgeon and Anesthesiologist  Anesthesia Plan Comments:         Anesthesia Quick Evaluation

## 2013-04-09 NOTE — Op Note (Signed)
Bianca Pollard 10-Sep-1946 [id number] 03/07/2013  Preoperative diagnosis: Recurrent epigastric hernia  Postoperative diagnosis: Same  Procedure: repair with mesh  Surgeon: Currie Paris, MD, FACS   Anesthesia: General   Clinical History and Indications: in 2005 this patient underwent repair of a small epigastric hernia. She said she has a lot of suction and apparently another hernia was found subsequent to that and repaired by a Engineer, petroleum. She now has a bulge at the lower end of her midline scar and a CT scan shows a small midline hernia above the umbilicus containing only fat. After discussion with the patient we elected to repair this.    Description of Procedure: the patient was seen in the preoperative area and the plan is confirmed. I marked the operative site at the lower end of her prior midline scar.she was taken to the operating room and after satisfactory general endotracheal anesthesia was obtained the abdomen was prepped and draped and a timeout performed. I made a midline vertical incision starting at the midpoint of her old scar and going somewhat below it. Subcutaneous tissue and scar was divided and identified some fat protruding through a small defect. I elevated the subcutaneous tissues from the fascia in all directions around the defect for distance of about 3 cm. I small hernia sac and make sure everything was reduced and cleaned off. The defect could not get my finger as it is only about a centimeter or a centimeter and a half in diameter. I closed it with 2 interrupted sutures of 0 Prolene. They were tied but not cut.  Some broad mesh was then cut to cover the area. The mesh measured 5 x 6 cm. I threaded the 2 Prolene sutures through the mesh and used those to anchor it. It was then sutured down at the periphery in all directions using multiple sutures of 0 Prolene. I then checked to make sure everything was dry. I injected 20 cc of 0.25% plain Marcaine. The  skin flaps were tacked down to the mesh and the subcutaneous closed with 2-0 Vicryl followed by 4-0 Monocryl subcuticular and Dermabond.  The patient tolerated the procedure well. There were no complications. Counts were correct. Blood loss was minimal.  Currie Paris, MD, FACS 04/09/2013 12:31 PM

## 2013-04-09 NOTE — Transfer of Care (Signed)
Immediate Anesthesia Transfer of Care Note  Patient: Bianca Pollard  Procedure(s) Performed: Procedure(s): REPAIR RECURRENT EPIGASTRIC HERNIA  with mesh (N/A)  Patient Location: PACU  Anesthesia Type:General  Level of Consciousness: awake, alert  and oriented  Airway & Oxygen Therapy: Patient Spontanous Breathing and Patient connected to nasal cannula oxygen  Post-op Assessment: Report given to PACU RN, Post -op Vital signs reviewed and stable and Patient moving all extremities X 4  Post vital signs: Reviewed and stable  Complications: No apparent anesthesia complications

## 2013-04-10 ENCOUNTER — Encounter (HOSPITAL_COMMUNITY): Payer: Self-pay | Admitting: Surgery

## 2013-04-27 ENCOUNTER — Ambulatory Visit (INDEPENDENT_AMBULATORY_CARE_PROVIDER_SITE_OTHER): Payer: Medicare Other | Admitting: Surgery

## 2013-04-27 ENCOUNTER — Encounter (INDEPENDENT_AMBULATORY_CARE_PROVIDER_SITE_OTHER): Payer: Self-pay | Admitting: Surgery

## 2013-04-27 VITALS — BP 102/62 | HR 76 | Temp 99.0°F | Resp 16 | Ht 64.0 in | Wt 140.4 lb

## 2013-04-27 DIAGNOSIS — Z9889 Other specified postprocedural states: Secondary | ICD-10-CM

## 2013-04-27 DIAGNOSIS — K432 Incisional hernia without obstruction or gangrene: Secondary | ICD-10-CM

## 2013-04-27 NOTE — Patient Instructions (Signed)
We will see you again on an as needed basis. Please call the office at [phone] if you have any questions or concerns. Thank you for allowing us to take care of you.  

## 2013-04-27 NOTE — Progress Notes (Signed)
NAME: Bianca Pollard                                            DOB: [birth year] DATE: 04/27/2013                                                  MRN: [id number]  CC:  Chief Complaint  Patient presents with  . Routine Post Op    1st po hernia rep    HPI: This patient comes in for post op follow-up .Sheunderwent repair of an incisional hernia on 04/09/13. She feels that she is doing well.  PE:  VITAL SIGNS: BP 102/62  Pulse 76  Temp(Src) 99 F (37.2 C) (Temporal)  Resp 16  Ht 5\' 4"  (1.626 m)  Wt 140 lb 6.4 oz (63.685 kg)  BMI 24.09 kg/m2  General: The patient appears to be healthy, NAD Abnd is benign and wound is healing. No infectino repair is solid  IMPRESSION: The patient is doing well S/P incisional hernia repair.    PLAN: RTC PRN. May resume normal activities

## 2013-05-03 ENCOUNTER — Ambulatory Visit: Payer: Medicare Other

## 2013-05-04 ENCOUNTER — Ambulatory Visit
Admission: RE | Admit: 2013-05-04 | Discharge: 2013-05-04 | Disposition: A | Discharge status: Home / Self Care | Payer: Medicare Other | Source: Ambulatory Visit

## 2013-05-04 DIAGNOSIS — Z1231 Encounter for screening mammogram for malignant neoplasm of breast: Secondary | ICD-10-CM

## 2013-06-13 ENCOUNTER — Other Ambulatory Visit: Payer: Self-pay

## 2013-09-10 ENCOUNTER — Other Ambulatory Visit: Payer: Self-pay | Admitting: Family Medicine

## 2013-09-10 DIAGNOSIS — N6314 Unspecified lump in the right breast, lower inner quadrant: Secondary | ICD-10-CM

## 2013-09-13 ENCOUNTER — Other Ambulatory Visit: Payer: Self-pay

## 2013-09-26 ENCOUNTER — Other Ambulatory Visit: Payer: Self-pay | Admitting: Family Medicine

## 2013-09-26 ENCOUNTER — Ambulatory Visit
Admission: RE | Admit: 2013-09-26 | Discharge: 2013-09-26 | Disposition: A | Discharge status: Home / Self Care | Payer: Medicare Other | Source: Ambulatory Visit | Attending: Family Medicine | Admitting: Family Medicine

## 2013-09-26 DIAGNOSIS — N6314 Unspecified lump in the right breast, lower inner quadrant: Secondary | ICD-10-CM

## 2013-09-26 DIAGNOSIS — N6323 Unspecified lump in the left breast, lower outer quadrant: Secondary | ICD-10-CM

## 2014-03-25 ENCOUNTER — Other Ambulatory Visit: Payer: Self-pay

## 2014-03-25 DIAGNOSIS — Z1231 Encounter for screening mammogram for malignant neoplasm of breast: Secondary | ICD-10-CM

## 2014-05-06 ENCOUNTER — Ambulatory Visit
Admission: RE | Admit: 2014-05-06 | Discharge: 2014-05-06 | Disposition: A | Discharge status: Home / Self Care | Payer: Medicare Other | Source: Ambulatory Visit

## 2014-05-06 DIAGNOSIS — Z1231 Encounter for screening mammogram for malignant neoplasm of breast: Secondary | ICD-10-CM

## 2015-03-31 ENCOUNTER — Other Ambulatory Visit: Payer: Self-pay

## 2015-03-31 DIAGNOSIS — Z1231 Encounter for screening mammogram for malignant neoplasm of breast: Secondary | ICD-10-CM

## 2015-05-08 ENCOUNTER — Ambulatory Visit: Payer: Self-pay

## 2015-05-30 ENCOUNTER — Ambulatory Visit
Admission: RE | Admit: 2015-05-30 | Discharge: 2015-05-30 | Disposition: A | Discharge status: Home / Self Care | Payer: Medicare Other | Source: Ambulatory Visit

## 2015-05-30 DIAGNOSIS — Z1231 Encounter for screening mammogram for malignant neoplasm of breast: Secondary | ICD-10-CM

## 2016-05-18 ENCOUNTER — Other Ambulatory Visit: Payer: Self-pay | Admitting: Family Medicine

## 2016-05-18 DIAGNOSIS — Z1231 Encounter for screening mammogram for malignant neoplasm of breast: Secondary | ICD-10-CM

## 2016-06-02 ENCOUNTER — Ambulatory Visit
Admission: RE | Admit: 2016-06-02 | Discharge: 2016-06-02 | Disposition: A | Discharge status: Home / Self Care | Payer: Medicare Other | Source: Ambulatory Visit | Attending: Family Medicine | Admitting: Family Medicine

## 2016-06-02 DIAGNOSIS — Z1231 Encounter for screening mammogram for malignant neoplasm of breast: Secondary | ICD-10-CM

## 2017-04-18 ENCOUNTER — Other Ambulatory Visit: Payer: Self-pay | Admitting: Family Medicine

## 2017-04-18 DIAGNOSIS — Z1231 Encounter for screening mammogram for malignant neoplasm of breast: Secondary | ICD-10-CM

## 2017-06-03 ENCOUNTER — Ambulatory Visit
Admission: RE | Admit: 2017-06-03 | Discharge: 2017-06-03 | Disposition: A | Discharge status: Home / Self Care | Payer: Medicare Other | Source: Ambulatory Visit | Attending: Family Medicine | Admitting: Family Medicine

## 2017-06-03 DIAGNOSIS — Z1231 Encounter for screening mammogram for malignant neoplasm of breast: Secondary | ICD-10-CM

## 2017-06-29 ENCOUNTER — Encounter (HOSPITAL_COMMUNITY): Payer: Self-pay | Admitting: Emergency Medicine

## 2017-06-29 ENCOUNTER — Emergency Department (HOSPITAL_COMMUNITY): Payer: No Typology Code available for payment source

## 2017-06-29 ENCOUNTER — Emergency Department (HOSPITAL_COMMUNITY)
Admission: EM | Admit: 2017-06-29 | Discharge: 2017-06-29 | Disposition: A | Discharge status: Home / Self Care | Payer: No Typology Code available for payment source | Attending: Emergency Medicine | Admitting: Emergency Medicine

## 2017-06-29 DIAGNOSIS — Y999 Unspecified external cause status: Secondary | ICD-10-CM | POA: Diagnosis not present

## 2017-06-29 DIAGNOSIS — Y939 Activity, unspecified: Secondary | ICD-10-CM | POA: Insufficient documentation

## 2017-06-29 DIAGNOSIS — Y929 Unspecified place or not applicable: Secondary | ICD-10-CM | POA: Diagnosis not present

## 2017-06-29 DIAGNOSIS — Z87891 Personal history of nicotine dependence: Secondary | ICD-10-CM | POA: Insufficient documentation

## 2017-06-29 DIAGNOSIS — S6991XA Unspecified injury of right wrist, hand and finger(s), initial encounter: Secondary | ICD-10-CM | POA: Diagnosis present

## 2017-06-29 DIAGNOSIS — Z7982 Long term (current) use of aspirin: Secondary | ICD-10-CM | POA: Insufficient documentation

## 2017-06-29 DIAGNOSIS — S52501A Unspecified fracture of the lower end of right radius, initial encounter for closed fracture: Secondary | ICD-10-CM

## 2017-06-29 DIAGNOSIS — Z79899 Other long term (current) drug therapy: Secondary | ICD-10-CM | POA: Diagnosis not present

## 2017-06-29 MED ORDER — HYDROMORPHONE HCL 1 MG/ML IJ SOLN
0.5000 mg | Freq: Once | INTRAMUSCULAR | Status: AC
  Administered 2017-06-29: 0.5 mg via INTRAVENOUS
  Filled 2017-06-29: qty 1

## 2017-06-29 MED ORDER — HYDROCODONE-ACETAMINOPHEN 5-325 MG PO TABS: 1.0000 | ORAL_TABLET | Freq: Four times a day (QID) | ORAL | 0 refills | Status: DC | PRN
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
## 2017-06-29 NOTE — Discharge Instructions (Signed)
You have a broken right wrist.  Please call and follow up promptly with hand specialist for further care.  If you are unable to follow up with your hand specialist, please follow up with DR. Grandville Silos.  Return if you develop numbness, increasing pain or if you have other concerns.
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
## 2017-06-29 NOTE — ED Notes (Signed)
Bed: WTR7 Expected date:  Expected time:  Means of arrival:  Comments: EMS wrist deformity
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
## 2017-06-30 ENCOUNTER — Encounter (HOSPITAL_BASED_OUTPATIENT_CLINIC_OR_DEPARTMENT_OTHER): Payer: Self-pay | Admitting: *Deleted

## 2017-06-30 ENCOUNTER — Other Ambulatory Visit: Payer: Self-pay | Admitting: Orthopedic Surgery
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
## 2017-07-05 ENCOUNTER — Ambulatory Visit (HOSPITAL_BASED_OUTPATIENT_CLINIC_OR_DEPARTMENT_OTHER): Payer: No Typology Code available for payment source | Admitting: Anesthesiology

## 2017-07-05 ENCOUNTER — Ambulatory Visit (HOSPITAL_BASED_OUTPATIENT_CLINIC_OR_DEPARTMENT_OTHER)
Admission: RE | Admit: 2017-07-05 | Discharge: 2017-07-05 | Disposition: A | Discharge status: Home / Self Care | Payer: No Typology Code available for payment source | Source: Ambulatory Visit | Attending: Orthopedic Surgery | Admitting: Orthopedic Surgery

## 2017-07-05 ENCOUNTER — Encounter (HOSPITAL_BASED_OUTPATIENT_CLINIC_OR_DEPARTMENT_OTHER): Payer: Self-pay

## 2017-07-05 ENCOUNTER — Encounter (HOSPITAL_BASED_OUTPATIENT_CLINIC_OR_DEPARTMENT_OTHER)
Admission: RE | Disposition: A | Discharge status: Home / Self Care | Payer: Self-pay | Source: Ambulatory Visit | Attending: Orthopedic Surgery

## 2017-07-05 ENCOUNTER — Ambulatory Visit (HOSPITAL_COMMUNITY): Payer: No Typology Code available for payment source

## 2017-07-05 DIAGNOSIS — M1812 Unilateral primary osteoarthritis of first carpometacarpal joint, left hand: Secondary | ICD-10-CM | POA: Diagnosis not present

## 2017-07-05 DIAGNOSIS — S5290XA Unspecified fracture of unspecified forearm, initial encounter for closed fracture: Secondary | ICD-10-CM

## 2017-07-05 DIAGNOSIS — Z87891 Personal history of nicotine dependence: Secondary | ICD-10-CM | POA: Diagnosis not present

## 2017-07-05 DIAGNOSIS — F419 Anxiety disorder, unspecified: Secondary | ICD-10-CM | POA: Insufficient documentation

## 2017-07-05 DIAGNOSIS — Z882 Allergy status to sulfonamides status: Secondary | ICD-10-CM | POA: Diagnosis not present

## 2017-07-05 DIAGNOSIS — Z905 Acquired absence of kidney: Secondary | ICD-10-CM | POA: Diagnosis not present

## 2017-07-05 DIAGNOSIS — M8580 Other specified disorders of bone density and structure, unspecified site: Secondary | ICD-10-CM | POA: Diagnosis not present

## 2017-07-05 DIAGNOSIS — Z886 Allergy status to analgesic agent status: Secondary | ICD-10-CM | POA: Insufficient documentation

## 2017-07-05 DIAGNOSIS — M19032 Primary osteoarthritis, left wrist: Secondary | ICD-10-CM | POA: Diagnosis not present

## 2017-07-05 DIAGNOSIS — S52571A Other intraarticular fracture of lower end of right radius, initial encounter for closed fracture: Secondary | ICD-10-CM | POA: Diagnosis present

## 2017-07-05 HISTORY — DX: Other specified postprocedural states: Z98.890

## 2017-07-05 HISTORY — DX: Nausea with vomiting, unspecified: R11.2

## 2017-07-05 HISTORY — DX: Adverse effect of unspecified anesthetic, initial encounter: T41.45XA

## 2017-07-05 HISTORY — DX: Other complications of anesthesia, initial encounter: T88.59XA

## 2017-07-05 HISTORY — PX: OPEN REDUCTION INTERNAL FIXATION (ORIF) DISTAL RADIAL FRACTURE: SHX5989

## 2017-07-05 SURGERY — OPEN REDUCTION INTERNAL FIXATION (ORIF) DISTAL RADIUS FRACTURE
Anesthesia: General | Site: Wrist | Laterality: Right

## 2017-07-05 MED ORDER — ONDANSETRON HCL 4 MG/2ML IJ SOLN
INTRAMUSCULAR | Status: DC | PRN
  Administered 2017-07-05: 4 mg via INTRAVENOUS

## 2017-07-05 MED ORDER — LACTATED RINGERS IV SOLN
INTRAVENOUS | Status: DC
  Administered 2017-07-05: 13:00:00 via INTRAVENOUS

## 2017-07-05 MED ORDER — CEFAZOLIN SODIUM-DEXTROSE 2-4 GM/100ML-% IV SOLN
2.0000 g | INTRAVENOUS | Status: AC
  Administered 2017-07-05: 2 g via INTRAVENOUS

## 2017-07-05 MED ORDER — CEFAZOLIN SODIUM-DEXTROSE 2-4 GM/100ML-% IV SOLN
INTRAVENOUS | Status: AC
  Filled 2017-07-05: qty 100

## 2017-07-05 MED ORDER — DEXAMETHASONE SODIUM PHOSPHATE 10 MG/ML IJ SOLN
INTRAMUSCULAR | Status: AC
  Filled 2017-07-05: qty 1

## 2017-07-05 MED ORDER — FENTANYL CITRATE (PF) 100 MCG/2ML IJ SOLN
INTRAMUSCULAR | Status: AC
  Filled 2017-07-05: qty 2

## 2017-07-05 MED ORDER — DEXAMETHASONE SODIUM PHOSPHATE 4 MG/ML IJ SOLN
INTRAMUSCULAR | Status: DC | PRN
  Administered 2017-07-05: 10 mg via INTRAVENOUS

## 2017-07-05 MED ORDER — ONDANSETRON HCL 4 MG/2ML IJ SOLN
INTRAMUSCULAR | Status: AC
  Filled 2017-07-05: qty 2

## 2017-07-05 MED ORDER — PROPOFOL 10 MG/ML IV BOLUS
INTRAVENOUS | Status: DC | PRN
  Administered 2017-07-05: 15 mg via INTRAVENOUS
  Administered 2017-07-05: 50 mg via INTRAVENOUS

## 2017-07-05 MED ORDER — 0.9 % SODIUM CHLORIDE (POUR BTL) OPTIME
TOPICAL | Status: DC | PRN
  Administered 2017-07-05: 200 mL

## 2017-07-05 MED ORDER — ONDANSETRON HCL 4 MG/2ML IJ SOLN
4.0000 mg | Freq: Once | INTRAMUSCULAR | Status: AC | PRN
  Administered 2017-07-05: 4 mg via INTRAVENOUS

## 2017-07-05 MED ORDER — LIDOCAINE 2% (20 MG/ML) 5 ML SYRINGE
INTRAMUSCULAR | Status: DC | PRN
  Administered 2017-07-05: 80 mg via INTRAVENOUS

## 2017-07-05 MED ORDER — MIDAZOLAM HCL 2 MG/2ML IJ SOLN
INTRAMUSCULAR | Status: AC
  Filled 2017-07-05: qty 2

## 2017-07-05 MED ORDER — MIDAZOLAM HCL 2 MG/2ML IJ SOLN
1.0000 mg | INTRAMUSCULAR | Status: DC | PRN
  Administered 2017-07-05: 1 mg via INTRAVENOUS

## 2017-07-05 MED ORDER — FENTANYL CITRATE (PF) 100 MCG/2ML IJ SOLN
25.0000 ug | INTRAMUSCULAR | Status: DC | PRN
  Administered 2017-07-05: 50 ug via INTRAVENOUS
  Administered 2017-07-05: 25 ug via INTRAVENOUS

## 2017-07-05 MED ORDER — BUPIVACAINE-EPINEPHRINE (PF) 0.5% -1:200000 IJ SOLN
INTRAMUSCULAR | Status: DC | PRN
  Administered 2017-07-05: 20 mL via PERINEURAL

## 2017-07-05 MED ORDER — SCOPOLAMINE 1 MG/3DAYS TD PT72: 1.0000 | MEDICATED_PATCH | Freq: Once | TRANSDERMAL | Status: DC | PRN

## 2017-07-05 MED ORDER — FENTANYL CITRATE (PF) 100 MCG/2ML IJ SOLN
50.0000 ug | INTRAMUSCULAR | Status: DC | PRN
  Administered 2017-07-05: 50 ug via INTRAVENOUS

## 2017-07-05 SURGICAL SUPPLY — 73 items
BANDAGE COBAN STERILE 2 (GAUZE/BANDAGES/DRESSINGS) IMPLANT
BIT DRILL 2.2 SS TIBIAL (BIT) ×3 IMPLANT
BLADE MINI RND TIP GREEN BEAV (BLADE) IMPLANT
BLADE SURG 15 STRL LF DISP TIS (BLADE) ×2 IMPLANT
BLADE SURG 15 STRL SS (BLADE) ×4
BNDG COHESIVE 4X5 TAN STRL (GAUZE/BANDAGES/DRESSINGS) ×3 IMPLANT
BNDG ESMARK 4X9 LF (GAUZE/BANDAGES/DRESSINGS) ×3 IMPLANT
BNDG GAUZE ELAST 4 BULKY (GAUZE/BANDAGES/DRESSINGS) ×3 IMPLANT
BRUSH SCRUB EZ PLAIN DRY (MISCELLANEOUS) ×3 IMPLANT
CANISTER SUCT 1200ML W/VALVE (MISCELLANEOUS) ×3 IMPLANT
CHLORAPREP W/TINT 26ML (MISCELLANEOUS) ×3 IMPLANT
CORD BIPOLAR FORCEPS 12FT (ELECTRODE) ×3 IMPLANT
COVER BACK TABLE 60X90IN (DRAPES) ×3 IMPLANT
COVER MAYO STAND STRL (DRAPES) ×3 IMPLANT
CUFF TOURNIQUET SINGLE 18IN (TOURNIQUET CUFF) ×3 IMPLANT
CUFF TOURNIQUET SINGLE 24IN (TOURNIQUET CUFF) IMPLANT
DRAPE C-ARM 42X72 X-RAY (DRAPES) ×3 IMPLANT
DRAPE EXTREMITY T 121X128X90 (DRAPE) ×3 IMPLANT
DRAPE SURG 17X23 STRL (DRAPES) ×3 IMPLANT
DRSG ADAPTIC 3X8 NADH LF (GAUZE/BANDAGES/DRESSINGS) ×3 IMPLANT
DRSG EMULSION OIL 3X3 NADH (GAUZE/BANDAGES/DRESSINGS) IMPLANT
ELECT REM PT RETURN 9FT ADLT (ELECTROSURGICAL) ×3
ELECTRODE REM PT RTRN 9FT ADLT (ELECTROSURGICAL) ×1 IMPLANT
GAUZE SPONGE 4X4 12PLY STRL LF (GAUZE/BANDAGES/DRESSINGS) ×3 IMPLANT
GLOVE BIO SURGEON STRL SZ 6.5 (GLOVE) ×2 IMPLANT
GLOVE BIO SURGEON STRL SZ7 (GLOVE) ×6 IMPLANT
GLOVE BIO SURGEON STRL SZ7.5 (GLOVE) ×3 IMPLANT
GLOVE BIO SURGEONS STRL SZ 6.5 (GLOVE) ×1
GLOVE BIOGEL PI IND STRL 7.0 (GLOVE) ×2 IMPLANT
GLOVE BIOGEL PI IND STRL 8 (GLOVE) ×1 IMPLANT
GLOVE BIOGEL PI INDICATOR 7.0 (GLOVE) ×4
GLOVE BIOGEL PI INDICATOR 8 (GLOVE) ×2
GLOVE ECLIPSE 6.5 STRL STRAW (GLOVE) ×3 IMPLANT
GOWN STRL REUS W/ TWL LRG LVL3 (GOWN DISPOSABLE) ×2 IMPLANT
GOWN STRL REUS W/ TWL XL LVL3 (GOWN DISPOSABLE) ×1 IMPLANT
GOWN STRL REUS W/TWL LRG LVL3 (GOWN DISPOSABLE) ×4
GOWN STRL REUS W/TWL XL LVL3 (GOWN DISPOSABLE) ×5 IMPLANT
NEEDLE HYPO 25X1 1.5 SAFETY (NEEDLE) IMPLANT
NS IRRIG 1000ML POUR BTL (IV SOLUTION) ×3 IMPLANT
PACK BASIN DAY SURGERY FS (CUSTOM PROCEDURE TRAY) ×3 IMPLANT
PADDING CAST ABS 4INX4YD NS (CAST SUPPLIES)
PADDING CAST ABS COTTON 4X4 ST (CAST SUPPLIES) IMPLANT
PEG LOCKING SMOOTH 2.2X12 (Peg) ×3 IMPLANT
PEG LOCKING SMOOTH 2.2X18 (Peg) ×3 IMPLANT
PEG LOCKING SMOOTH 2.2X20 (Screw) ×9 IMPLANT
PEG LOCKING SMOOTH 2.2X22 (Screw) ×6 IMPLANT
PENCIL BUTTON HOLSTER BLD 10FT (ELECTRODE) ×3 IMPLANT
PLATE MEDIUM DVR RIGHT (Plate) ×3 IMPLANT
RUBBERBAND STERILE (MISCELLANEOUS) IMPLANT
SCREW LOCK 12X2.7X 3 LD (Screw) ×1 IMPLANT
SCREW LOCK 14X2.7X 3 LD TPR (Screw) ×3 IMPLANT
SCREW LOCKING 2.7X12MM (Screw) ×2 IMPLANT
SCREW LOCKING 2.7X14 (Screw) ×6 IMPLANT
SLEEVE SCD COMPRESS KNEE MED (MISCELLANEOUS) ×3 IMPLANT
SLING ARM FOAM STRAP LRG (SOFTGOODS) IMPLANT
SLING ARM FOAM STRAP MED (SOFTGOODS) ×3 IMPLANT
SPLINT PLASTER CAST XFAST 3X15 (CAST SUPPLIES) IMPLANT
SPLINT PLASTER XTRA FASTSET 3X (CAST SUPPLIES)
STOCKINETTE 6  STRL (DRAPES) ×2
STOCKINETTE 6 STRL (DRAPES) ×1 IMPLANT
SUCTION FRAZIER HANDLE 10FR (MISCELLANEOUS) ×2
SUCTION TUBE FRAZIER 10FR DISP (MISCELLANEOUS) ×1 IMPLANT
SUT VIC AB 2-0 PS2 27 (SUTURE) ×3 IMPLANT
SUT VICRYL 4-0 PS2 18IN ABS (SUTURE) IMPLANT
SUT VICRYL RAPIDE 4-0 (SUTURE) IMPLANT
SUT VICRYL RAPIDE 4/0 PS 2 (SUTURE) ×3 IMPLANT
SYR 10ML LL (SYRINGE) IMPLANT
SYR BULB 3OZ (MISCELLANEOUS) ×3 IMPLANT
TOWEL OR 17X24 6PK STRL BLUE (TOWEL DISPOSABLE) ×3 IMPLANT
TOWEL OR NON WOVEN STRL DISP B (DISPOSABLE) ×3 IMPLANT
TUBE CONNECTING 20'X1/4 (TUBING) ×1
TUBE CONNECTING 20X1/4 (TUBING) ×2 IMPLANT
UNDERPAD 30X30 (UNDERPADS AND DIAPERS) ×3 IMPLANT

## 2017-07-05 NOTE — Transfer of Care (Signed)
Immediate Anesthesia Transfer of Care Note  Patient: Bianca Pollard  Procedure(s) Performed: Procedure(s): OPEN TREATMENT OF RIGHT DISTAL RADIUS FRACTURE  (Right)  Patient Location: PACU  Anesthesia Type:GA combined with regional for post-op pain  Level of Consciousness: awake, oriented and patient cooperative  Airway & Oxygen Therapy: Patient Spontanous Breathing  Post-op Assessment: Report given to RN and Post -op Vital signs reviewed and stable  Post vital signs: Reviewed and stable  Last Vitals:  Vitals:   07/05/17 1255 07/05/17 1300  BP:  (!) 112/54  Pulse: 62 73  Resp: 16 14  Temp:    SpO2: 100% 100%    Last Pain:  Vitals:   07/05/17 1218  TempSrc: Oral         Complications: No apparent anesthesia complications

## 2017-07-05 NOTE — Anesthesia Procedure Notes (Signed)
Anesthesia Regional Block: Supraclavicular block   Pre-Anesthetic Checklist: ,, timeout performed, Correct Patient, Correct Site, Correct Laterality, Correct Procedure, Correct Position, site marked, Risks and benefits discussed,  Surgical consent,  Pre-op evaluation,  At surgeon's request and post-op pain management  Laterality: Right  Prep: chloraprep       Needles:  Injection technique: Single-shot  Needle Type: Echogenic Needle     Needle Length: 5cm  Needle Gauge: 21     Additional Needles:   Procedures: ultrasound guided,,,,,,,,  Narrative:  Start time: 07/05/2017 12:45 PM End time: 07/05/2017 12:48 PM Injection made incrementally with aspirations every 5 mL.  Performed by: Personally  Anesthesiologist: Renold Don E  Additional Notes: No pain on injection. No increased resistance to injection. Injection made in 5cc increments. Good needle visualization. Patient tolerated the procedure well.

## 2017-07-05 NOTE — Interval H&P Note (Signed)
History and Physical Interval Note:  07/05/2017 1:20 PM  Bianca Pollard  has presented today for surgery, with the diagnosis of RIGHT DISTAL RADIUS FRACTURE S52.551A  The various methods of treatment have been discussed with the patient and family. After consideration of risks, benefits and other options for treatment, the patient has consented to  Procedure(s): OPEN TREATMENT OF RIGHT DISTAL RADIUS FRACTURE  (Right) as a surgical intervention .  The patient's history has been reviewed, patient examined, no change in status, stable for surgery.  I have reviewed the patient's chart and labs.  Questions were answered to the patient's satisfaction.     [name]

## 2017-07-05 NOTE — Anesthesia Preprocedure Evaluation (Addendum)
Anesthesia Evaluation  Patient identified by MRN, date of birth, ID band Patient awake    Reviewed: Allergy & Precautions, H&P , NPO status , Patient's Chart, lab work & pertinent test results  History of Anesthesia Complications (+) PONV  Airway Mallampati: I  TM Distance: >3 FB Neck ROM: Full    Dental no notable dental hx. (+) Teeth Intact, Dental Advisory Given   Pulmonary former smoker,    Pulmonary exam normal breath sounds clear to auscultation       Cardiovascular Exercise Tolerance: Good negative cardio ROS   Rhythm:Regular Rate:Normal     Neuro/Psych PSYCHIATRIC DISORDERS Anxiety negative neurological ROS     GI/Hepatic negative GI ROS, Neg liver ROS,   Endo/Other  negative endocrine ROS  Renal/GU Renal InsufficiencyRenal diseaseS/p donor nephrectomy 2004  negative genitourinary   Musculoskeletal  (+) Arthritis ,   Abdominal   Peds  Hematology negative hematology ROS (+)   Anesthesia Other Findings   Reproductive/Obstetrics negative OB ROS                           Anesthesia Physical  Anesthesia Plan  ASA: II  Anesthesia Plan: General   Post-op Pain Management:  Regional for Post-op pain   Induction: Intravenous  PONV Risk Score and Plan: 3 and Ondansetron, Dexamethasone, Propofol infusion and Treatment may vary due to age or medical condition  Airway Management Planned: LMA  Additional Equipment:   Intra-op Plan:   Post-operative Plan: Extubation in OR  Informed Consent: I have reviewed the patients History and Physical, chart, labs and discussed the procedure including the risks, benefits and alternatives for the proposed anesthesia with the patient or authorized representative who has indicated his/her understanding and acceptance.   Dental advisory given  Plan Discussed with: CRNA  Anesthesia Plan Comments:       Anesthesia Quick Evaluation

## 2017-07-05 NOTE — Progress Notes (Signed)
Assisted Dr. Brock with right, ultrasound guided, supraclavicular block. Side rails up, monitors on throughout procedure. See vital signs in flow sheet. Tolerated Procedure well. 

## 2017-07-05 NOTE — Discharge Instructions (Signed)
Post Anesthesia Home Care Instructions  Activity: Get plenty of rest for the remainder of the day. A responsible individual must stay with you for 24 hours following the procedure.  For the next 24 hours, DO NOT: -Drive a car -Paediatric nurse -Drink alcoholic beverages -Take any medication unless instructed by your physician -Make any legal decisions or sign important papers.  Meals: Start with liquid foods such as gelatin or soup. Progress to regular foods as tolerated. Avoid greasy, spicy, heavy foods. If nausea and/or vomiting occur, drink only clear liquids until the nausea and/or vomiting subsides. Call your physician if vomiting continues.  Special Instructions/Symptoms: Your throat may feel dry or sore from the anesthesia or the breathing tube placed in your throat during surgery. If this causes discomfort, gargle with warm salt water. The discomfort should disappear within 24 hours.  If you had a scopolamine patch placed behind your ear for the management of post- operative nausea and/or vomiting:  1. The medication in the patch is effective for 72 hours, after which it should be removed.  Wrap patch in a tissue and discard in the trash. Wash hands thoroughly with soap and water. 2. You may remove the patch earlier than 72 hours if you experience unpleasant side effects which may include dry mouth, dizziness or visual disturbances. 3. Avoid touching the patch. Wash your hands with soap and water after contact with the patch.      Regional Anesthesia Blocks  1. Numbness or the inability to move the "blocked" extremity may last from 3-48 hours after placement. The length of time depends on the medication injected and your individual response to the medication. If the numbness is not going away after 48 hours, call your surgeon.  2. The extremity that is blocked will need to be protected until the numbness is gone and the  Strength has returned. Because you cannot feel it, you  will need to take extra care to avoid injury. Because it may be weak, you may have difficulty moving it or using it. You may not know what position it is in without looking at it while the block is in effect.  3. For blocks in the legs and feet, returning to weight bearing and walking needs to be done carefully. You will need to wait until the numbness is entirely gone and the strength has returned. You should be able to move your leg and foot normally before you try and bear weight or walk. You will need someone to be with you when you first try to ensure you do not fall and possibly risk injury.  4. Bruising and tenderness at the needle site are common side effects and will resolve in a few days.  5. Persistent numbness or new problems with movement should be communicated to the surgeon or the [facility] [phone] [facility] [phone]).   Discharge Instructions   You have a dressing with a plaster splint incorporated in it. Move your fingers as much as possible, making a full fist and fully opening the fist. Elevate your hand above your elbow to reduce pain & swelling of the digits.  Ice over the operative site or in the arm pit may be helpful to reduce pain & swelling.  DO NOT USE HEAT. Pain medicine has been prescribed for you.  Continue current pain medicine plan with 650 mg tylenol every 6 hours and supplement with Oxycodone 5 mg for severe pain. Zofran for nausea. Leave the dressing in  place until you return to our office.  You may shower, but keep the bandage clean & dry.  You may drive a car when you are off of prescription pain medications and can safely control your vehicle with both hands. Our office will call you to arrange follow-up   Please call [phone] during normal business hours or [phone] after hours for any problems. Including the following:  - excessive redness of the incisions - drainage for more than 4 days - fever of  more than 101.5 F  *Please note that pain medications will not be refilled after hours or on weekends.

## 2017-07-05 NOTE — Anesthesia Postprocedure Evaluation (Signed)
Anesthesia Post Note  Patient: Bianca Pollard  Procedure(s) Performed: Procedure(s) (LRB): OPEN TREATMENT OF RIGHT DISTAL RADIUS FRACTURE  (Right)     Patient location during evaluation: PACU Anesthesia Type: General Level of consciousness: awake and alert Pain management: pain level controlled Vital Signs Assessment: post-procedure vital signs reviewed and stable Respiratory status: spontaneous breathing, nonlabored ventilation and respiratory function stable Cardiovascular status: blood pressure returned to baseline and stable Postop Assessment: no signs of nausea or vomiting Anesthetic complications: no    Last Vitals:  Vitals:   07/05/17 1500 07/05/17 1515  BP: 117/70 124/73  Pulse: (!) 55 (!) 56  Resp: (!) 8 11  Temp:    SpO2: 100% 100%    Last Pain:  Vitals:   07/05/17 1515  TempSrc:   PainSc: [name]

## 2017-07-05 NOTE — Op Note (Signed)
07/05/2017  1:22 PM  PATIENT:  Bianca Pollard  71 y.o. female  PRE-OPERATIVE DIAGNOSIS:  Comminuted right intra-articular distal radius fx (2 articular fragments, non-displaced)  POST-OPERATIVE DIAGNOSIS:  Same  PROCEDURE:  ORIF R intra-articular distal radius fx (74163)  SURGEON: Rayvon Char. Grandville Silos, MD  PHYSICIAN ASSISTANT: Morley Kos, OPA-C  ANESTHESIA:  regional and general  SPECIMENS:  None  DRAINS: None  EBL:  less than 50 mL  PREOPERATIVE INDICATIONS:  Bianca Pollard is a  71 y.o. female with a comminuted, closed R DRFx  The risks benefits and alternatives were discussed with the patient preoperatively including but not limited to the risks of infection, bleeding, nerve injury, cardiopulmonary complications, the need for revision surgery, among others, and the patient verbalized understanding and consented to proceed.  OPERATIVE IMPLANTS: Biomet DVR volar plate/screws/pegs  OPERATIVE PROCEDURE: After receiving prophylactic antibiotics and a regional block, the patient was escorted to the operative theatre and placed in a supine position. General anesthesia was administered.  A surgical "time-out" was performed during which the planned procedure, proposed operative site, and the correct patient identity were compared to the operative consent and agreement confirmed by the circulating nurse according to current facility policy. Following application of a tourniquet to the operative extremity, the exposed skin was pre-scrubbed with Hibiclens scrub brush and then was prepped with Chloraprep and draped in the usual sterile fashion. The limb was exsanguinated with an Esmarch bandage and the tourniquet inflated to approximately 185mmHg higher than systolic BP.   A sinusoidal-shaped incision was marked and made over the FCR axis and the distal forearm. The skin was incised sharply with scalpel, subcutaneous tissues with blunt and spreading dissection. The FCR axis was  exploited deeply. The pronator quadratus was reflected in an L-shaped ulnarly and the brachioradialis was split in a Z-plasty fashion for later reapproximation. The fracture was inspected and provisionally reduced.  This was confirmed fluoroscopically. The appropriately sized plate was selected and found to fit well. It was placed in its provisional alignment of the radius and this was confirmed fluoroscopically.  It was secured to the radius with a screw through the slotted hole.  Additional adjustments were made as necessary, and the distal holes were all drilled and filled.  Peg/screw length distally was selected on the shorter side of measurements to minimize the risk for dorsal cortical penetration. The remainder of the proximal holes were drilled and filled.   Final images were obtained and the DRUJ was examined for stability. It was found to be sufficiently stable. The wound was then copiously irrigated and the brachioradialis repaired with 2-0 Vicryl Rapide suture followed by repair of the pronator quadratus with the same suture type. Tourniquet was released and additional hemostasis obtained and the skin was closed with 2-0 Vicryl deep dermal buried sutures followed by running 4-0 Vicryl Rapide horizontal mattress suture in the skin. A bulky dressing with a volar plaster component was applied and the patient was taken to the recovery room in stable condition.  DISPOSITION: The patient will be discharged home today with typical post-op instructions, returning in 10-15 days for reevaluation with new x-rays of the affected wrist out of the splint to include an inclined lateral and then transition to therapy to have a custom splint constructed and begin rehabilitation.

## 2017-07-06 ENCOUNTER — Encounter (HOSPITAL_BASED_OUTPATIENT_CLINIC_OR_DEPARTMENT_OTHER): Payer: Self-pay | Admitting: Orthopedic Surgery

## 2017-07-07 ENCOUNTER — Encounter (HOSPITAL_BASED_OUTPATIENT_CLINIC_OR_DEPARTMENT_OTHER): Payer: Self-pay | Admitting: Orthopedic Surgery

## 2018-03-21 ENCOUNTER — Other Ambulatory Visit: Payer: Self-pay | Admitting: Neurological Surgery

## 2018-04-05 NOTE — Pre-Procedure Instructions (Addendum)
SMRITI BARKOW  04/05/2018      [id number] [address], [place] [address] Phone: [phone] Fax: [phone]    Your procedure is scheduled on Thurs., April 13, 2018 from 7:30AM-10:47AM  Report to [facility] Admitting Entrance "A" at 5:30AM  Call this number if you have problems the morning of surgery:  [phone]   Remember:  No food after midnight on June 5th  Take these medicines the morning of surgery with A SIP OF WATER: Cetirizine (ZYRTEC) and Fluticasone (FLONASE). If needed Acetaminophen (TYLENOL) for pain.  As of today, stop taking all Other Aspirin Products, Vitamins, Fish oils, and Herbal medications. Also stop all NSAIDS i.e. Advil, Ibuprofen, Motrin, Aleve, Anaprox, Naproxen, BC and Goody Powders.    Do not wear jewelry, make-up, or nail polish (fingers).  Do not wear lotions, powders, or perfumes, or deodorant.  Do not shave 48 hours prior to surgery.    Do not bring valuables to the hospital.  [facility] is not responsible for any belongings or valuables.  Contacts, dentures or bridgework may not be worn into surgery.  Leave your suitcase in the car.  After surgery it may be brought to your room.  For patients admitted to the hospital, discharge time will be determined by your treatment team.  Patients discharged the day of surgery will not be allowed to drive home.   Special instructions:  [place]- Preparing For Surgery  Before surgery, you can play an important role. Because skin is not sterile, your skin needs to be as free of germs as possible. You can reduce the number of germs on your skin by washing with CHG (chlorahexidine gluconate) Soap before surgery.  CHG is an antiseptic cleaner which kills germs and bonds with the skin to continue killing germs even after washing.    Oral Hygiene is also important to reduce your risk of infection.  Remember - BRUSH YOUR TEETH THE MORNING OF  SURGERY WITH YOUR REGULAR TOOTHPASTE  Please do not use if you have an allergy to CHG or antibacterial soaps. If your skin becomes reddened/irritated stop using the CHG.  Do not shave (including legs and underarms) for at least 48 hours prior to first CHG shower. It is OK to shave your face.  Please follow these instructions carefully.   1. Shower the NIGHT BEFORE SURGERY and the MORNING OF SURGERY with CHG.   2. If you chose to wash your hair, wash your hair first as usual with your normal shampoo.  3. After you shampoo, rinse your hair and body thoroughly to remove the shampoo.  4. Use CHG as you would any other liquid soap. You can apply CHG directly to the skin and wash gently with a scrungie or a clean washcloth.   5. Apply the CHG Soap to your body ONLY FROM THE NECK DOWN.  Do not use on open wounds or open sores. Avoid contact with your eyes, ears, mouth and genitals (private parts). Wash Face and genitals (private parts)  with your normal soap.  6. Wash thoroughly, paying special attention to the area where your surgery will be performed.  7. Thoroughly rinse your body with warm water from the neck down.  8. DO NOT shower/wash with your normal soap after using and rinsing off the CHG Soap.  9. Pat yourself dry with a CLEAN TOWEL.  10. Wear CLEAN PAJAMAS to bed the night before surgery, wear comfortable  clothes the morning of surgery  11. Place CLEAN SHEETS on your bed the night of your first shower and DO NOT SLEEP WITH PETS.  Day of Surgery:  Do not apply any deodorants/lotions.  Please wear clean clothes to the hospital/surgery center.   Remember to brush your teeth WITH YOUR REGULAR TOOTHPASTE.  Please read over the following fact sheets that you were given. Pain Booklet, Coughing and Deep Breathing, MRSA Information and Surgical Site Infection Prevention

## 2018-04-06 ENCOUNTER — Encounter (HOSPITAL_COMMUNITY): Payer: Self-pay

## 2018-04-06 ENCOUNTER — Encounter (HOSPITAL_COMMUNITY)
Admission: RE | Admit: 2018-04-06 | Discharge: 2018-04-06 | Disposition: A | Discharge status: Home / Self Care | Payer: Medicare Other | Source: Ambulatory Visit | Attending: Neurological Surgery | Admitting: Neurological Surgery

## 2018-04-06 ENCOUNTER — Other Ambulatory Visit: Payer: Self-pay | Admitting: Neurological Surgery

## 2018-04-06 ENCOUNTER — Other Ambulatory Visit: Payer: Self-pay

## 2018-04-06 DIAGNOSIS — Z01812 Encounter for preprocedural laboratory examination: Secondary | ICD-10-CM | POA: Diagnosis not present

## 2018-04-06 LAB — BASIC METABOLIC PANEL
Anion gap: 10 (ref 5–15)
BUN: 26 mg/dL — ABNORMAL HIGH (ref 6–20)
CALCIUM: 10.1 mg/dL (ref 8.9–10.3)
CO2: 28 mmol/L (ref 22–32)
Chloride: 103 mmol/L (ref 101–111)
Creatinine, Ser: 1.21 mg/dL — ABNORMAL HIGH (ref 0.44–1.00)
GFR calc Af Amer: 51 mL/min — ABNORMAL LOW (ref >60)
GFR calc non Af Amer: 44 mL/min — ABNORMAL LOW (ref >60)
GLUCOSE: 98 mg/dL (ref 65–99)
Potassium: 4.4 mmol/L (ref 3.5–5.1)
Sodium: 141 mmol/L (ref 135–145)

## 2018-04-06 LAB — CBC
HCT: 40 % (ref 36.0–46.0)
Hemoglobin: 13.2 g/dL (ref 12.0–15.0)
MCH: 29.1 pg (ref 26.0–34.0)
MCHC: 33 g/dL (ref 30.0–36.0)
MCV: 88.3 fL (ref 78.0–100.0)
PLATELETS: 230 10*3/uL (ref 150–400)
RBC: 4.53 MIL/uL (ref 3.87–5.11)
RDW: 12.6 % (ref 11.5–15.5)
WBC: 4.2 10*3/uL (ref 4.0–10.5)

## 2018-04-06 LAB — TYPE AND SCREEN
ABO/RH(D): O POS
Antibody Screen: NEGATIVE

## 2018-04-06 LAB — SURGICAL PCR SCREEN
MRSA, PCR: NEGATIVE
Staphylococcus aureus: NEGATIVE

## 2018-04-06 LAB — ABO/RH: ABO/RH(D): O POS

## 2018-04-06 NOTE — Progress Notes (Signed)
[facility]  Cardiologist - Denies  Chest x-ray - Denies  EKG - Denies  Stress Test - Denies  ECHO - Denies  Cardiac Cath - Denies  Sleep Study - Denies CPAP - None  LABS- 04/06/18: CBC, BMP, T/S   ASA- Denies  Anesthesia- No  Pt denies having chest pain, sob, or fever at this time. All instructions explained to the pt, with a verbal understanding of the material. Pt agrees to go over the instructions while at home for a better understanding. The opportunity to ask questions was provided.

## 2018-04-12 NOTE — Anesthesia Preprocedure Evaluation (Signed)
Anesthesia Evaluation    Reviewed: Allergy & Precautions, H&P , Patient's Chart, lab work & pertinent test results  History of Anesthesia Complications (+) PONV and history of anesthetic complications  Airway Mallampati: I  TM Distance: >3 FB Neck ROM: Full    Dental no notable dental hx. (+) Teeth Intact, Dental Advisory Given   Pulmonary former smoker,    Pulmonary exam normal breath sounds clear to auscultation       Cardiovascular Exercise Tolerance: Good negative cardio ROS   Rhythm:Regular Rate:Normal     Neuro/Psych PSYCHIATRIC DISORDERS Anxiety negative neurological ROS     GI/Hepatic negative GI ROS, Neg liver ROS,   Endo/Other  negative endocrine ROS  Renal/GU Renal InsufficiencyRenal diseaseS/p donor nephrectomy 2004  negative genitourinary   Musculoskeletal  (+) Arthritis ,   Abdominal   Peds  Hematology negative hematology ROS (+)   Anesthesia Other Findings   Reproductive/Obstetrics negative OB ROS                             Anesthesia Physical  Anesthesia Plan  ASA: II  Anesthesia Plan: General   Post-op Pain Management:    Induction: Intravenous  PONV Risk Score and Plan: 4 or greater and Ondansetron, Dexamethasone, Scopolamine patch - Pre-op and TIVA  Airway Management Planned: Oral ETT  Additional Equipment:   Intra-op Plan:   Post-operative Plan: Extubation in OR  Informed Consent:   Plan Discussed with: CRNA  Anesthesia Plan Comments:         Anesthesia Quick Evaluation

## 2018-04-13 ENCOUNTER — Inpatient Hospital Stay (HOSPITAL_COMMUNITY): Admission: RE | Admit: 2018-04-13 | Payer: Medicare Other | Source: Ambulatory Visit | Admitting: Neurological Surgery

## 2018-04-13 ENCOUNTER — Encounter (HOSPITAL_COMMUNITY)
Admission: RE | Disposition: A | Discharge status: Home / Self Care | Payer: Self-pay | Source: Ambulatory Visit | Attending: Neurological Surgery

## 2018-04-13 ENCOUNTER — Encounter (HOSPITAL_COMMUNITY): Admission: RE | Payer: Self-pay | Source: Ambulatory Visit

## 2018-04-13 ENCOUNTER — Inpatient Hospital Stay (HOSPITAL_COMMUNITY)
Admission: RE | Admit: 2018-04-13 | Discharge: 2018-04-14 | DRG: 455 | Disposition: A | Discharge status: Home / Self Care | Payer: Medicare Other | Source: Ambulatory Visit | Attending: Neurological Surgery | Admitting: Neurological Surgery

## 2018-04-13 ENCOUNTER — Inpatient Hospital Stay (HOSPITAL_COMMUNITY): Payer: Medicare Other

## 2018-04-13 ENCOUNTER — Inpatient Hospital Stay (HOSPITAL_COMMUNITY): Payer: Medicare Other | Admitting: Anesthesiology

## 2018-04-13 ENCOUNTER — Encounter (HOSPITAL_COMMUNITY): Payer: Self-pay | Admitting: Certified Registered Nurse Anesthetist

## 2018-04-13 DIAGNOSIS — Z87891 Personal history of nicotine dependence: Secondary | ICD-10-CM

## 2018-04-13 DIAGNOSIS — M48061 Spinal stenosis, lumbar region without neurogenic claudication: Principal | ICD-10-CM | POA: Diagnosis present

## 2018-04-13 DIAGNOSIS — Z419 Encounter for procedure for purposes other than remedying health state, unspecified: Secondary | ICD-10-CM

## 2018-04-13 DIAGNOSIS — M8580 Other specified disorders of bone density and structure, unspecified site: Secondary | ICD-10-CM | POA: Diagnosis present

## 2018-04-13 DIAGNOSIS — Z9089 Acquired absence of other organs: Secondary | ICD-10-CM

## 2018-04-13 DIAGNOSIS — Z803 Family history of malignant neoplasm of breast: Secondary | ICD-10-CM

## 2018-04-13 DIAGNOSIS — N6019 Diffuse cystic mastopathy of unspecified breast: Secondary | ICD-10-CM | POA: Diagnosis present

## 2018-04-13 DIAGNOSIS — Z981 Arthrodesis status: Secondary | ICD-10-CM

## 2018-04-13 DIAGNOSIS — M4316 Spondylolisthesis, lumbar region: Secondary | ICD-10-CM | POA: Diagnosis present

## 2018-04-13 DIAGNOSIS — Z973 Presence of spectacles and contact lenses: Secondary | ICD-10-CM

## 2018-04-13 DIAGNOSIS — Z905 Acquired absence of kidney: Secondary | ICD-10-CM

## 2018-04-13 DIAGNOSIS — M4310 Spondylolisthesis, site unspecified: Secondary | ICD-10-CM

## 2018-04-13 LAB — PROTIME-INR
INR: 0.99
Prothrombin Time: 13 seconds (ref 11.4–15.2)

## 2018-04-13 SURGERY — POSTERIOR LUMBAR FUSION 1 LEVEL
Anesthesia: General | Site: Back

## 2018-04-13 MED ORDER — KETAMINE HCL 10 MG/ML IJ SOLN
INTRAMUSCULAR | Status: DC | PRN
  Administered 2018-04-13: 25 mg via INTRAVENOUS
  Administered 2018-04-13: 10 mg via INTRAVENOUS

## 2018-04-13 MED ORDER — ACETAMINOPHEN 650 MG RE SUPP: 650.0000 mg | RECTAL | Status: DC | PRN

## 2018-04-13 MED ORDER — CHLORHEXIDINE GLUCONATE CLOTH 2 % EX PADS: 6.0000 | MEDICATED_PAD | Freq: Once | CUTANEOUS | Status: DC

## 2018-04-13 MED ORDER — KETAMINE HCL 10 MG/ML IJ SOLN
INTRAMUSCULAR | Status: AC
  Filled 2018-04-13: qty 1

## 2018-04-13 MED ORDER — ROCURONIUM BROMIDE 10 MG/ML (PF) SYRINGE
PREFILLED_SYRINGE | INTRAVENOUS | Status: AC
  Filled 2018-04-13: qty 10

## 2018-04-13 MED ORDER — ROCURONIUM BROMIDE 100 MG/10ML IV SOLN
INTRAVENOUS | Status: DC | PRN
  Administered 2018-04-13: 10 mg via INTRAVENOUS
  Administered 2018-04-13: 60 mg via INTRAVENOUS
  Administered 2018-04-13: 20 mg via INTRAVENOUS

## 2018-04-13 MED ORDER — LACTATED RINGERS IV SOLN
INTRAVENOUS | Status: DC | PRN
  Administered 2018-04-13: 07:00:00 via INTRAVENOUS

## 2018-04-13 MED ORDER — ONDANSETRON HCL 4 MG/2ML IJ SOLN
INTRAMUSCULAR | Status: DC | PRN
  Administered 2018-04-13: 4 mg via INTRAVENOUS

## 2018-04-13 MED ORDER — SODIUM CHLORIDE 0.9 % IV SOLN
INTRAVENOUS | Status: DC | PRN
  Administered 2018-04-13: 07:00:00

## 2018-04-13 MED ORDER — SODIUM CHLORIDE 0.9% FLUSH: 3.0000 mL | INTRAVENOUS | Status: DC | PRN

## 2018-04-13 MED ORDER — PROPOFOL 500 MG/50ML IV EMUL
INTRAVENOUS | Status: DC | PRN
  Administered 2018-04-13: 125 ug/kg/min via INTRAVENOUS

## 2018-04-13 MED ORDER — PHENYLEPHRINE HCL 10 MG/ML IJ SOLN
INTRAMUSCULAR | Status: DC | PRN
  Administered 2018-04-13: 25 ug/min via INTRAVENOUS

## 2018-04-13 MED ORDER — SODIUM CHLORIDE 0.9% FLUSH
3.0000 mL | Freq: Two times a day (BID) | INTRAVENOUS | Status: DC
  Administered 2018-04-13 (×2): 3 mL via INTRAVENOUS

## 2018-04-13 MED ORDER — ONDANSETRON HCL 4 MG PO TABS: 4.0000 mg | ORAL_TABLET | Freq: Four times a day (QID) | ORAL | Status: DC | PRN

## 2018-04-13 MED ORDER — PROMETHAZINE HCL 25 MG/ML IJ SOLN: 6.2500 mg | INTRAMUSCULAR | Status: DC | PRN

## 2018-04-13 MED ORDER — MENTHOL 3 MG MT LOZG: 1.0000 | LOZENGE | OROMUCOSAL | Status: DC | PRN

## 2018-04-13 MED ORDER — ONDANSETRON HCL 4 MG/2ML IJ SOLN
INTRAMUSCULAR | Status: AC
  Filled 2018-04-13: qty 2

## 2018-04-13 MED ORDER — CEFAZOLIN SODIUM-DEXTROSE 2-4 GM/100ML-% IV SOLN
2.0000 g | Freq: Three times a day (TID) | INTRAVENOUS | Status: AC
  Administered 2018-04-13 (×2): 2 g via INTRAVENOUS
  Filled 2018-04-13 (×2): qty 100

## 2018-04-13 MED ORDER — LACTATED RINGERS IV SOLN
INTRAVENOUS | Status: DC | PRN
  Administered 2018-04-13: 08:00:00 via INTRAVENOUS

## 2018-04-13 MED ORDER — OXYCODONE HCL 5 MG PO TABS
10.0000 mg | ORAL_TABLET | ORAL | Status: DC | PRN
  Administered 2018-04-13 – 2018-04-14 (×4): 10 mg via ORAL
  Filled 2018-04-13 (×4): qty 2

## 2018-04-13 MED ORDER — SUGAMMADEX SODIUM 200 MG/2ML IV SOLN
INTRAVENOUS | Status: AC
  Filled 2018-04-13: qty 2

## 2018-04-13 MED ORDER — SCOPOLAMINE 1 MG/3DAYS TD PT72
1.0000 | MEDICATED_PATCH | TRANSDERMAL | Status: DC
  Administered 2018-04-13: 1.5 mg via TRANSDERMAL
  Filled 2018-04-13: qty 1

## 2018-04-13 MED ORDER — MIDAZOLAM HCL 5 MG/5ML IJ SOLN
INTRAMUSCULAR | Status: DC | PRN
  Administered 2018-04-13: 2 mg via INTRAVENOUS

## 2018-04-13 MED ORDER — BUPIVACAINE HCL (PF) 0.25 % IJ SOLN
INTRAMUSCULAR | Status: AC
  Filled 2018-04-13: qty 30

## 2018-04-13 MED ORDER — HEPARIN SODIUM (PORCINE) 1000 UNIT/ML IJ SOLN
INTRAMUSCULAR | Status: DC | PRN
  Administered 2018-04-13: 5000 [IU]

## 2018-04-13 MED ORDER — VANCOMYCIN HCL 1000 MG IV SOLR
INTRAVENOUS | Status: DC | PRN
  Administered 2018-04-13: 1000 mg

## 2018-04-13 MED ORDER — PROPOFOL 10 MG/ML IV BOLUS
INTRAVENOUS | Status: AC
  Filled 2018-04-13: qty 20

## 2018-04-13 MED ORDER — EPHEDRINE SULFATE 50 MG/ML IJ SOLN
INTRAMUSCULAR | Status: DC | PRN
  Administered 2018-04-13: 10 mg via INTRAVENOUS

## 2018-04-13 MED ORDER — SENNA 8.6 MG PO TABS
1.0000 | ORAL_TABLET | Freq: Two times a day (BID) | ORAL | Status: DC
  Administered 2018-04-13 – 2018-04-14 (×2): 8.6 mg via ORAL
  Filled 2018-04-13 (×2): qty 1

## 2018-04-13 MED ORDER — LIDOCAINE HCL (CARDIAC) PF 100 MG/5ML IV SOSY
PREFILLED_SYRINGE | INTRAVENOUS | Status: DC | PRN
  Administered 2018-04-13: 100 mg via INTRAVENOUS

## 2018-04-13 MED ORDER — PROPOFOL 10 MG/ML IV BOLUS
INTRAVENOUS | Status: DC | PRN
  Administered 2018-04-13: 130 mg via INTRAVENOUS

## 2018-04-13 MED ORDER — BUPIVACAINE HCL (PF) 0.25 % IJ SOLN
INTRAMUSCULAR | Status: DC | PRN
  Administered 2018-04-13: 5 mL

## 2018-04-13 MED ORDER — HEPARIN SODIUM (PORCINE) 1000 UNIT/ML IJ SOLN
INTRAMUSCULAR | Status: AC
  Filled 2018-04-13: qty 1

## 2018-04-13 MED ORDER — PHENOL 1.4 % MT LIQD: 1.0000 | OROMUCOSAL | Status: DC | PRN

## 2018-04-13 MED ORDER — ONDANSETRON HCL 4 MG/2ML IJ SOLN: 4.0000 mg | Freq: Four times a day (QID) | INTRAMUSCULAR | Status: DC | PRN

## 2018-04-13 MED ORDER — METHOCARBAMOL 1000 MG/10ML IJ SOLN
500.0000 mg | Freq: Four times a day (QID) | INTRAMUSCULAR | Status: DC | PRN
  Filled 2018-04-13: qty 5

## 2018-04-13 MED ORDER — DEXAMETHASONE SODIUM PHOSPHATE 10 MG/ML IJ SOLN
INTRAMUSCULAR | Status: AC
  Filled 2018-04-13: qty 1

## 2018-04-13 MED ORDER — MIDAZOLAM HCL 2 MG/2ML IJ SOLN
INTRAMUSCULAR | Status: AC
  Filled 2018-04-13: qty 2

## 2018-04-13 MED ORDER — THROMBIN 20000 UNITS EX SOLR
CUTANEOUS | Status: AC
  Filled 2018-04-13: qty 20000

## 2018-04-13 MED ORDER — FENTANYL CITRATE (PF) 250 MCG/5ML IJ SOLN
INTRAMUSCULAR | Status: AC
  Filled 2018-04-13: qty 5

## 2018-04-13 MED ORDER — METHOCARBAMOL 500 MG PO TABS
500.0000 mg | ORAL_TABLET | Freq: Four times a day (QID) | ORAL | Status: DC | PRN
  Administered 2018-04-13 – 2018-04-14 (×3): 500 mg via ORAL
  Filled 2018-04-13 (×3): qty 1

## 2018-04-13 MED ORDER — SUGAMMADEX SODIUM 200 MG/2ML IV SOLN
INTRAVENOUS | Status: DC | PRN
  Administered 2018-04-13: 118.8 mg via INTRAVENOUS

## 2018-04-13 MED ORDER — ARTIFICIAL TEARS OPHTHALMIC OINT
TOPICAL_OINTMENT | OPHTHALMIC | Status: DC | PRN
  Administered 2018-04-13: 1 via OPHTHALMIC

## 2018-04-13 MED ORDER — FENTANYL CITRATE (PF) 100 MCG/2ML IJ SOLN
INTRAMUSCULAR | Status: DC | PRN
  Administered 2018-04-13 (×4): 50 ug via INTRAVENOUS

## 2018-04-13 MED ORDER — HYDROMORPHONE HCL 1 MG/ML IJ SOLN: 0.5000 mg | INTRAMUSCULAR | Status: DC | PRN

## 2018-04-13 MED ORDER — CEFAZOLIN SODIUM-DEXTROSE 2-4 GM/100ML-% IV SOLN
2.0000 g | INTRAVENOUS | Status: AC
  Administered 2018-04-13: 2 g via INTRAVENOUS
  Filled 2018-04-13: qty 100

## 2018-04-13 MED ORDER — POTASSIUM CHLORIDE IN NACL 20-0.9 MEQ/L-% IV SOLN: INTRAVENOUS | Status: DC

## 2018-04-13 MED ORDER — VANCOMYCIN HCL 1000 MG IV SOLR
INTRAVENOUS | Status: AC
  Filled 2018-04-13: qty 1000

## 2018-04-13 MED ORDER — THROMBIN 5000 UNITS EX SOLR
OROMUCOSAL | Status: DC | PRN
  Administered 2018-04-13: 07:00:00 via TOPICAL

## 2018-04-13 MED ORDER — FENTANYL CITRATE (PF) 100 MCG/2ML IJ SOLN
25.0000 ug | INTRAMUSCULAR | Status: DC | PRN
  Administered 2018-04-13 (×3): 25 ug via INTRAVENOUS

## 2018-04-13 MED ORDER — THROMBIN 20000 UNITS EX SOLR
CUTANEOUS | Status: DC | PRN
  Administered 2018-04-13: 07:00:00 via TOPICAL

## 2018-04-13 MED ORDER — LIDOCAINE 2% (20 MG/ML) 5 ML SYRINGE
INTRAMUSCULAR | Status: AC
  Filled 2018-04-13: qty 5

## 2018-04-13 MED ORDER — DEXAMETHASONE SODIUM PHOSPHATE 10 MG/ML IJ SOLN
10.0000 mg | INTRAMUSCULAR | Status: AC
Start: 2018-04-13 — End: 2018-04-13
  Administered 2018-04-13: 10 mg via INTRAVENOUS
  Filled 2018-04-13: qty 1

## 2018-04-13 MED ORDER — ACETAMINOPHEN 325 MG PO TABS: 650.0000 mg | ORAL_TABLET | ORAL | Status: DC | PRN

## 2018-04-13 MED ORDER — FENTANYL CITRATE (PF) 100 MCG/2ML IJ SOLN
INTRAMUSCULAR | Status: AC
  Filled 2018-04-13: qty 2

## 2018-04-13 MED ORDER — THROMBIN 5000 UNITS EX SOLR
CUTANEOUS | Status: AC
  Filled 2018-04-13: qty 5000

## 2018-04-13 MED ORDER — 0.9 % SODIUM CHLORIDE (POUR BTL) OPTIME
TOPICAL | Status: DC | PRN
  Administered 2018-04-13: 1000 mL

## 2018-04-13 SURGICAL SUPPLY — 61 items
BAG DECANTER FOR FLEXI CONT (MISCELLANEOUS) ×3 IMPLANT
BASKET BONE COLLECTION (BASKET) ×3 IMPLANT
BENZOIN TINCTURE PRP APPL 2/3 (GAUZE/BANDAGES/DRESSINGS) ×3 IMPLANT
BLADE CLIPPER SURG (BLADE) IMPLANT
BUR MATCHSTICK NEURO 3.0 LAGG (BURR) ×3 IMPLANT
CANISTER SUCT 3000ML PPV (MISCELLANEOUS) ×3 IMPLANT
CARTRIDGE OIL MAESTRO DRILL (MISCELLANEOUS) ×1 IMPLANT
CLOSURE WOUND 1/2 X4 (GAUZE/BANDAGES/DRESSINGS) ×1
CONT SPEC 4OZ CLIKSEAL STRL BL (MISCELLANEOUS) ×3 IMPLANT
COVER BACK TABLE 60X90IN (DRAPES) ×3 IMPLANT
DERMABOND ADVANCED (GAUZE/BANDAGES/DRESSINGS) ×2
DERMABOND ADVANCED .7 DNX12 (GAUZE/BANDAGES/DRESSINGS) ×1 IMPLANT
DIFFUSER DRILL AIR PNEUMATIC (MISCELLANEOUS) ×3 IMPLANT
DRAPE C-ARM 42X72 X-RAY (DRAPES) ×6 IMPLANT
DRAPE LAPAROTOMY 100X72X124 (DRAPES) ×3 IMPLANT
DRAPE POUCH INSTRU U-SHP 10X18 (DRAPES) ×3 IMPLANT
DRAPE SURG 17X23 STRL (DRAPES) ×3 IMPLANT
DRSG OPSITE POSTOP 4X6 (GAUZE/BANDAGES/DRESSINGS) ×3 IMPLANT
DURAPREP 26ML APPLICATOR (WOUND CARE) ×3 IMPLANT
ELECT REM PT RETURN 9FT ADLT (ELECTROSURGICAL) ×3
ELECTRODE REM PT RTRN 9FT ADLT (ELECTROSURGICAL) ×1 IMPLANT
EVACUATOR 1/8 PVC DRAIN (DRAIN) ×3 IMPLANT
GAUZE SPONGE 4X4 16PLY XRAY LF (GAUZE/BANDAGES/DRESSINGS) IMPLANT
GLOVE BIO SURGEON STRL SZ7 (GLOVE) ×3 IMPLANT
GLOVE BIO SURGEON STRL SZ8 (GLOVE) ×6 IMPLANT
GLOVE BIOGEL PI IND STRL 7.0 (GLOVE) ×3 IMPLANT
GLOVE BIOGEL PI IND STRL 7.5 (GLOVE) ×3 IMPLANT
GLOVE BIOGEL PI INDICATOR 7.0 (GLOVE) ×6
GLOVE BIOGEL PI INDICATOR 7.5 (GLOVE) ×6
GOWN STRL REUS W/ TWL LRG LVL3 (GOWN DISPOSABLE) IMPLANT
GOWN STRL REUS W/ TWL XL LVL3 (GOWN DISPOSABLE) ×2 IMPLANT
GOWN STRL REUS W/TWL 2XL LVL3 (GOWN DISPOSABLE) IMPLANT
GOWN STRL REUS W/TWL LRG LVL3 (GOWN DISPOSABLE)
GOWN STRL REUS W/TWL XL LVL3 (GOWN DISPOSABLE) ×4
HEMOSTAT POWDER KIT SURGIFOAM (HEMOSTASIS) ×3 IMPLANT
KIT BASIN OR (CUSTOM PROCEDURE TRAY) ×3 IMPLANT
KIT BONE MRW ASP ANGEL CPRP (KITS) ×3 IMPLANT
KIT TURNOVER KIT B (KITS) ×3 IMPLANT
MARKER SKIN DUAL TIP RULER LAB (MISCELLANEOUS) ×3 IMPLANT
MILL MEDIUM DISP (BLADE) ×3 IMPLANT
NEEDLE HYPO 25X1 1.5 SAFETY (NEEDLE) ×3 IMPLANT
NS IRRIG 1000ML POUR BTL (IV SOLUTION) ×3 IMPLANT
OIL CARTRIDGE MAESTRO DRILL (MISCELLANEOUS) ×3
PACK LAMINECTOMY NEURO (CUSTOM PROCEDURE TRAY) ×3 IMPLANT
PAD ARMBOARD 7.5X6 YLW CONV (MISCELLANEOUS) ×9 IMPLANT
PUTTY DBM ALLOSYNC PURE 10CC (Putty) ×3 IMPLANT
ROD PC 5.5X40 TI ARSENAL (Rod) ×6 IMPLANT
SCREW CBX 5.5X35MM ARSENAL (Screw) ×12 IMPLANT
SCREW SET SPINAL ARSENAL 47127 (Screw) ×12 IMPLANT
SPACER IDENTITI PS 10X9X25 10D (Spacer) ×6 IMPLANT
SPONGE LAP 4X18 RFD (DISPOSABLE) IMPLANT
SPONGE SURGIFOAM ABS GEL 100 (HEMOSTASIS) ×3 IMPLANT
STRIP CLOSURE SKIN 1/2X4 (GAUZE/BANDAGES/DRESSINGS) ×2 IMPLANT
SUT VIC AB 0 CT1 18XCR BRD8 (SUTURE) ×1 IMPLANT
SUT VIC AB 0 CT1 8-18 (SUTURE) ×2
SUT VIC AB 2-0 CP2 18 (SUTURE) ×3 IMPLANT
SUT VIC AB 3-0 SH 8-18 (SUTURE) ×6 IMPLANT
TOWEL GREEN STERILE (TOWEL DISPOSABLE) ×3 IMPLANT
TOWEL GREEN STERILE FF (TOWEL DISPOSABLE) ×3 IMPLANT
TRAY FOLEY MTR SLVR 16FR STAT (SET/KITS/TRAYS/PACK) ×3 IMPLANT
WATER STERILE IRR 1000ML POUR (IV SOLUTION) ×3 IMPLANT

## 2018-04-13 NOTE — Transfer of Care (Signed)
Immediate Anesthesia Transfer of Care Note  Patient: Bianca Pollard  Procedure(s) Performed: POSTERIOR LUMBAR NTERBODY FUSION - LUMBAR FOUR-LUMBAR FIVE (N/A Back)  Patient Location: PACU  Anesthesia Type:General  Level of Consciousness: awake, alert  and oriented  Airway & Oxygen Therapy: Patient Spontanous Breathing and Patient connected to nasal cannula oxygen  Post-op Assessment: Report given to RN and Post -op Vital signs reviewed and stable  Post vital signs: Reviewed and stable  Last Vitals:  Vitals Value Taken Time  BP 118/60 04/13/2018 10:17 AM  Temp    Pulse 67 04/13/2018 10:19 AM  Resp 15 04/13/2018 10:19 AM  SpO2 100 % 04/13/2018 10:19 AM  Vitals shown include unvalidated device data.  Last Pain:  Vitals:   04/13/18 0656  TempSrc: Oral  PainSc:          Complications: No apparent anesthesia complications

## 2018-04-13 NOTE — OR Nursing (Signed)
ACD A 10 ML USED WITH ARTHREX BONE MARROW ASPIRATE

## 2018-04-13 NOTE — Op Note (Signed)
04/13/2018  10:02 AM  PATIENT:  Bianca Pollard  72 y.o. 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 Decompressive lumbar laminectomy L4-5 requiring more work than would be required for a simple exposure of the disk for PLIF in order to adequately decompress the neural elements and address the spinal stenosis 2. Posterior lumbar interbody fusion L4-5 using porous titanium interbody cages packed with morcellized allograft and autograft 3. Posterior fixation L4-5 using Alphatec cortical pedicle screws.  4. Intertransverse arthrodesis L4-5 using morcellized autograft and allograft.  SURGEON:  Sherley Bounds, MD  ASSISTANTS: Dr. Arnoldo Morale  ANESTHESIA:  General  EBL: 100 ml  Total I/O In: 1000 [I.V.:1000] Out: 250 [Urine:150; Blood:100]  BLOOD ADMINISTERED:none  DRAINS: none   INDICATION FOR PROCEDURE: This patient presented with severe back and bilateral leg pain. Imaging revealed spondylolisthesis with stenosis L4-5. The patient tried a reasonable attempt at conservative medical measures without relief. I recommended decompression and instrumented fusion to address the stenosis as well as the segmental  instability.  Patient understood the risks, benefits, and alternatives and potential outcomes and wished to proceed.  PROCEDURE DETAILS:  The patient was brought to the operating room. After induction of generalized endotracheal anesthesia the patient was rolled into the prone position on chest rolls and all pressure points were padded. The patient's lumbar region was cleaned and then prepped with DuraPrep and draped in the usual sterile fashion. Anesthesia was injected and then a dorsal midline incision was made and carried down to the lumbosacral fascia. The fascia was opened and the paraspinous musculature was taken down in a subperiosteal fashion to expose L4-5. A self-retaining  retractor was placed. Intraoperative fluoroscopy confirmed my level, and I started with placement of the L4 cortical pedicle screws. The pedicle screw entry zones were identified utilizing surface landmarks and  AP and lateral fluoroscopy. I scored the cortex with the high-speed drill and then used the hand drill to drill an upward and outward direction into the pedicle. I then tapped line to line. I then placed a 5.5 x 35 mm cortical pedicle screw into the pedicles of L4 bilaterally. I dissected in a suprafascial plane to expose the right iliac crest. I opened the fascia over the crest and used a Jamshidi needle to extract 60 mL of bone marrow aspirate which was spun down her arthrodesis. I closed the fascia over the iliac crest. I then turned my attention to the decompression and complete lumbar laminectomies, hemi- facetectomies, and foraminotomies were performed at L4-5. The patient had significant spinal stenosis and this required more work than would be required for a simple exposure of the disc for posterior lumbar interbody fusion which would only require a limited laminotomy. Much more generous decompression and generous foraminotomy was undertaken in order to adequately decompress the neural elements and address the patient's leg pain. The yellow ligament was removed to expose the underlying dura and nerve roots, and generous foraminotomies were performed to adequately decompress the neural elements. Both the exiting and traversing nerve roots were decompressed on both sides until a coronary dilator passed easily along the nerve roots. Once the decompression was complete, I turned my attention to the posterior lower lumbar interbody fusion. The epidural venous vasculature was coagulated and cut sharply. Disc space was incised and the initial discectomy was performed with pituitary rongeurs. The disc space was distracted with sequential distractors to a height of 10 mm.  We then used a series of scrapers and  shavers to prepare the endplates for fusion. The midline was prepared with Epstein curettes. Once the complete discectomy was finished, we packed an appropriate sized interbody cage with local autograft and morcellized allograft, gently retracted the nerve root, and tapped the cage into position at L4-5.  The midline between the cages was packed with morselized autograft and allograft. We then turned our attention to the placement of the lower pedicle screws. The pedicle screw entry zones were identified utilizing surface landmarks and fluoroscopy. I drilled into each pedicle utilizing the hand drill, and tapped each pedicle with the appropriate tap. We palpated with a ball probe to assure no break in the cortex. We then placed 5.5 x 35 mm cortical pedicle screws into the pedicles bilaterally at L5. We then decorticated the transverse processes and laid a mixture of morcellized autograft and allograft out over these to perform intertransverse arthrodesis at L4-5. We then placed lordotic rods into the multiaxial screw heads of the pedicle screws and locked these in position with the locking caps and anti-torque device. We then checked our construct with AP and lateral fluoroscopy. Irrigated with copious amounts of bacitracin-containing saline solution. Inspected the nerve roots once again to assure adequate decompression, lined to the dura with Gelfoam, placed powdered vancomycin into the wound, and closed the muscle and the fascia with 0 Vicryl. Closed the subcutaneous tissues with 2-0 Vicryl and subcuticular tissues with 3-0 Vicryl. The skin was closed with benzoin and Steri-Strips. Dressing was then applied, the patient was awakened from general anesthesia and transported to the recovery room in stable condition. At the end of the procedure all sponge, needle and instrument counts were correct.   PLAN OF CARE: admit to inpatient  PATIENT DISPOSITION:  PACU - hemodynamically stable.   Delay start of  Pharmacological VTE agent (>24hrs) due to surgical blood loss or risk of bleeding:  yes

## 2018-04-13 NOTE — Anesthesia Postprocedure Evaluation (Signed)
Anesthesia Post Note  Patient: Bianca Pollard  Procedure(s) Performed: POSTERIOR LUMBAR NTERBODY FUSION - LUMBAR FOUR-LUMBAR FIVE (N/A Back)     Patient location during evaluation: PACU Anesthesia Type: General Level of consciousness: sedated Pain management: pain level controlled Vital Signs Assessment: post-procedure vital signs reviewed and stable Respiratory status: spontaneous breathing and respiratory function stable Cardiovascular status: stable Postop Assessment: no apparent nausea or vomiting Anesthetic complications: no    Last Vitals:  Vitals:   04/13/18 1215 04/13/18 1245  BP:  138/72  Pulse:  65  Resp:  18  Temp: (!) 36.3 C 36.4 C  SpO2:  100%    Last Pain:  Vitals:   04/13/18 1245  TempSrc: Oral  PainSc:       LLE Sensation: Full sensation (04/13/18 1253)   RLE Sensation: Full sensation (04/13/18 1253)      [employer]

## 2018-04-13 NOTE — Evaluation (Signed)
Physical Therapy Evaluation and Discharge Patient Details Name: Bianca Pollard MRN: [id number] DOB: [birth year] Today's Date: 04/13/2018   History of Present Illness  Pt is a 72 y/o female s/p L4-5 PLIF. PMH includes osteopenia and R ORIF of radial fx.   Clinical Impression  Patient evaluated by Physical Therapy with no further acute PT needs identified. All education has been completed and the patient has no further questions. Pt overall steady with gait and stair navigation. No LOB noted, requiring gross supervision throughout. Reports friends will be staying with her at d/c. See below for any follow-up Physical Therapy or equipment needs. PT is signing off. Thank you for this referral. If needs change, please reconsult.      Follow Up Recommendations No PT follow up    Equipment Recommendations  None recommended by PT    Recommendations for Other Services       Precautions / Restrictions Precautions Precautions: Back Precaution Booklet Issued: Yes (comment) Precaution Comments: REviewed back precautions with pt.  Required Braces or Orthoses: Spinal Brace Spinal Brace: Lumbar corset;Applied in standing position Restrictions Weight Bearing Restrictions: No      Mobility  Bed Mobility Overal bed mobility: Needs Assistance Bed Mobility: Rolling;Sidelying to Sit;Sit to Sidelying Rolling: Supervision Sidelying to sit: Supervision     Sit to sidelying: Supervision General bed mobility comments: Supervision to ensure log roll technique   Transfers Overall transfer level: Needs assistance Equipment used: None Transfers: Sit to/from Stand Sit to Stand: Supervision         General transfer comment: Supervision for safety.   Ambulation/Gait Ambulation/Gait assistance: Supervision;Min guard Ambulation Distance (Feet): 400 Feet Assistive device: None Gait Pattern/deviations: Step-through pattern;Decreased stride length Gait velocity: Decreased    General Gait  Details: Cautious gait, however, overall steady. Min guard to supervision for safety and no external assist required. No LOB noted. Educated about generalized walking program to perform at home.   Stairs Stairs: Yes Stairs assistance: Supervision Stair Management: No rails;Step to pattern;Forwards Number of Stairs: 1 General stair comments: Safe stair navigation technique. Supervision for safety. No LOB ntoed.   Wheelchair Mobility    Modified Rankin (Stroke Patients Only)       Balance Overall balance assessment: No apparent balance deficits (not formally assessed)                                           Pertinent Vitals/Pain Pain Assessment: Faces Faces Pain Scale: Hurts even more Pain Location: back  Pain Descriptors / Indicators: Aching;Operative site guarding Pain Intervention(s): Limited activity within patient's tolerance;Monitored during session;Repositioned    Home Living Family/patient expects to be discharged to:: Private residence Living Arrangements: Alone Available Help at Discharge: Friend(s);Available 24 hours/day Type of Home: House Home Access: Stairs to enter Entrance Stairs-Rails: None Entrance Stairs-Number of Steps: 1(porch step ) Home Layout: One level Home Equipment: None      Prior Function Level of Independence: Independent               Hand Dominance        Extremity/Trunk Assessment   Upper Extremity Assessment Upper Extremity Assessment: Defer to OT evaluation    Lower Extremity Assessment Lower Extremity Assessment: Overall WFL for tasks assessed    Cervical / Trunk Assessment Cervical / Trunk Assessment: Other exceptions Cervical / Trunk Exceptions: s/p PLIF   Communication   Communication: No difficulties  Cognition Arousal/Alertness: Awake/alert Behavior During Therapy: WFL for tasks assessed/performed Overall Cognitive Status: Within Functional Limits for tasks assessed                                         General Comments      Exercises     Assessment/Plan    PT Assessment Patent does not need any further PT services  PT Problem List         PT Treatment Interventions      PT Goals (Current goals can be found in the Care Plan section)  Acute Rehab PT Goals Patient Stated Goal: to go home  PT Goal Formulation: With patient Time For Goal Achievement: 04/13/18 Potential to Achieve Goals: Good    Frequency     Barriers to discharge        Co-evaluation               AM-PAC PT "6 Clicks" Daily Activity  Outcome Measure Difficulty turning over in bed (including adjusting bedclothes, sheets and blankets)?: None Difficulty moving from lying on back to sitting on the side of the bed? : A Little Difficulty sitting down on and standing up from a chair with arms (e.g., wheelchair, bedside commode, etc,.)?: None Help needed moving to and from a bed to chair (including a wheelchair)?: None Help needed walking in hospital room?: None Help needed climbing 3-5 steps with a railing? : None 6 Click Score: 23    End of Session Equipment Utilized During Treatment: Gait belt;Back brace Activity Tolerance: Patient tolerated treatment well Patient left: in bed;with call bell/phone within reach Nurse Communication: Mobility status PT Visit Diagnosis: Other abnormalities of gait and mobility (R26.89);Pain Pain - part of body: (back )    Time: 4431-5400 PT Time Calculation (min) (ACUTE ONLY): 16 min   Charges:   PT Evaluation $PT Eval Low Complexity: 1 Low     PT G Codes:        Leighton Ruff, PT, DPT  Acute Rehabilitation Services  Pager: [phone]   Rudean Hitt 04/13/2018, 6:19 PM

## 2018-04-13 NOTE — H&P (Signed)
Subjective: Patient is a 72 y.o. female admitted for PLIF. Onset of symptoms was several months ago, gradually worsening since that time.  The pain is rated severe, and is located at the across the lower back and radiates to legs. The pain is described as aching and occurs all day. The symptoms have been progressive. Symptoms are exacerbated by exercise. 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 Anxiety   . Arthritis    thumb and wrist left  . Complication of anesthesia   . Fibrocystic breast disease   . Fibroid uterus   . Hemorrhoid   . Mental disorder   . Osteopenia   . PONV (postoperative nausea and vomiting)   . Renal insufficiency     Past Surgical History:  Procedure Laterality Date  . BREAST BIOPSY Bilateral 20+ yrs ago   benign  . CESAREAN SECTION  1980  . CHEILECTOMY  10/2004   left great toe  . [place] REPAIR  03/16/2004  . Lead  2012  . LIPOSUCTION  2012  . 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 OPEN REDUCTION INTERNAL FIXATION (ORIF) DISTAL RADIAL FRACTURE Right 07/05/2017   Procedure: OPEN TREATMENT OF RIGHT DISTAL RADIUS FRACTURE ;  Surgeon: Milly Jakob, MD;  Location: [place] [facility];  Service: Orthopedics;  Laterality: Right;  . TONSILLECTOMY    . VENTRAL HERNIA REPAIR N/A 04/09/2013   Procedure: REPAIR RECURRENT EPIGASTRIC HERNIA  with mesh;  Surgeon: Haywood Lasso, MD;  Location: [facility];  Service: General;  Laterality: N/A;    Prior to Admission medications   Medication Sig Start Date End Date Taking? Authorizing Provider  acetaminophen (TYLENOL) 325 MG tablet Take 650 mg by mouth every 6 (six) hours as needed.    Yes [provider]  Calcium Carbonate-Vitamin D (CALCIUM + D PO) Take 1 tablet by mouth 2 (two) times daily.    Yes [provider]  cetirizine (ZYRTEC) 10 MG tablet Take 10 mg by mouth every morning.   Yes [provider]  fluticasone  (FLONASE) 50 MCG/ACT nasal spray Place 1 spray into both nostrils daily.   Yes [provider]  Multiple Vitamins-Minerals (PRESERVISION AREDS 2 PO) Take 1 capsule by mouth 2 (two) times daily.   Yes [provider]  traZODone (DESYREL) 150 MG tablet Take 150 mg by mouth at bedtime.   Yes [provider]   Allergies  Allergen Reactions  . Sulfa Antibiotics Other (See Comments)    UNSPECIFIED REACTION  Childhood allergy   . Nsaids Other (See Comments)    One kidney    Social History   Tobacco Use  . Smoking status: Former Smoker    Last attempt to quit: 11/08/1974    Years since quitting: 43.4  . Smokeless tobacco: Never Used  Substance Use Topics  . Alcohol use: Yes    Alcohol/week: 2.4 oz    Types: 4 Glasses of wine per week    Comment: Weekly.    Family History  Problem Relation Age of Onset  . Cancer Maternal Grandmother        Breast  . Cancer Paternal Grandfather        Breast     Review of Systems  Positive ROS: neg  All other systems have been reviewed and were otherwise negative with the exception of those mentioned in the HPI and as above.  Objective: Vital signs in last 24  hours: Temp:  [97.9 F (36.6 C)] 97.9 F (36.6 C) (06/06 0656) Pulse Rate:  [59] 59 (06/06 0656) Resp:  [20] 20 (06/06 0656) BP: (143)/(75) 143/75 (06/06 0656) SpO2:  [95 %] 95 % (06/06 0656) Weight:  [59.4 kg (131 lb)] 59.4 kg (131 lb) (06/06 0656)  General Appearance: Alert, cooperative, no distress, appears stated age Head: Normocephalic, without obvious abnormality, atraumatic Eyes: PERRL, conjunctiva/corneas clear, EOM'Pollard intact    Neck: Supple, symmetrical, trachea midline Back: Symmetric, no curvature, ROM normal, no CVA tenderness Lungs:  respirations unlabored Heart: Regular rate and rhythm Abdomen: Soft, non-tender Extremities: Extremities normal, atraumatic, no cyanosis or edema Pulses: 2+ and symmetric all extremities Skin: Skin color,  texture, turgor normal, no rashes or lesions  NEUROLOGIC:   Mental status: Alert and oriented x4,  no aphasia, good attention span, fund of knowledge, and memory Motor Exam - grossly normal Sensory Exam - grossly normal Reflexes: 1+ Coordination - grossly normal Gait - grossly normal Balance - grossly normal Cranial Nerves: I: smell Not tested  II: visual acuity  OS: nl    OD: nl  II: visual fields Full to confrontation  II: pupils Equal, round, reactive to light  III,VII: ptosis None  III,IV,VI: extraocular muscles  Full ROM  V: mastication Normal  V: facial light touch sensation  Normal  V,VII: corneal reflex  Present  VII: facial muscle function - upper  Normal  VII: facial muscle function - lower Normal  VIII: hearing Not tested  IX: soft palate elevation  Normal  IX,X: gag reflex Present  XI: trapezius strength  5/5  XI: sternocleidomastoid strength 5/5  XI: neck flexion strength  5/5  XII: tongue strength  Normal    Data Review Lab Results  Component Value Date   WBC 4.2 04/06/2018   HGB 13.2 04/06/2018   HCT 40.0 04/06/2018   MCV 88.3 04/06/2018   PLT 230 04/06/2018   Lab Results  Component Value Date   NA 141 04/06/2018   K 4.4 04/06/2018   CL 103 04/06/2018   CO2 28 04/06/2018   BUN 26 (H) 04/06/2018   CREATININE 1.21 (H) 04/06/2018   GLUCOSE 98 04/06/2018   No results found for: INR, PROTIME  Assessment/Plan:  Estimated body mass index is 22.49 kg/m as calculated from the following:   Height as of 04/06/18: 5\' 4"  (1.626 m).   Weight as of this encounter: 59.4 kg (131 lb). Patient admitted for PLIF L4-5. Patient has failed a reasonable attempt at conservative therapy.  I explained the condition and procedure to the patient and answered any questions.  Patient wishes to proceed with procedure as planned. Understands risks/ benefits and typical outcomes of procedure.   Bianca Pollard 04/13/2018 7:30 AM

## 2018-04-13 NOTE — Anesthesia Procedure Notes (Signed)
Procedure Name: Intubation Date/Time: 04/13/2018 7:45 AM Performed by: White, Amedeo Plenty, CRNA Pre-anesthesia Checklist: Patient identified, Emergency Drugs available, Suction available, Patient being monitored and Timeout performed Patient Re-evaluated:Patient Re-evaluated prior to induction Oxygen Delivery Method: Circle system utilized Preoxygenation: Pre-oxygenation with 100% oxygen Induction Type: IV induction Ventilation: Mask ventilation without difficulty Laryngoscope Size: Miller and 2 Grade View: Grade I Tube type: Oral Tube size: 7.0 mm Number of attempts: 1 Airway Equipment and Method: Patient positioned with wedge pillow and Stylet Placement Confirmation: ETT inserted through vocal cords under direct vision,  positive ETCO2,  CO2 detector and breath sounds checked- equal and bilateral Secured at: 22 cm Tube secured with: Tape Dental Injury: Teeth and Oropharynx as per pre-operative assessment

## 2018-04-14 MED ORDER — METHOCARBAMOL 500 MG PO TABS: 500.0000 mg | ORAL_TABLET | Freq: Four times a day (QID) | ORAL | 1 refills | Status: AC | PRN

## 2018-04-14 MED ORDER — DIPHENHYDRAMINE HCL 25 MG PO CAPS
25.0000 mg | ORAL_CAPSULE | ORAL | Status: DC | PRN
  Administered 2018-04-14: 25 mg via ORAL
  Filled 2018-04-14: qty 1

## 2018-04-14 MED ORDER — HYDROCODONE-ACETAMINOPHEN 5-325 MG PO TABS: 1.0000 | ORAL_TABLET | ORAL | 0 refills | Status: AC | PRN

## 2018-04-14 MED ORDER — HYDROCODONE-ACETAMINOPHEN 5-325 MG PO TABS
1.0000 | ORAL_TABLET | ORAL | Status: DC | PRN
  Administered 2018-04-14 (×2): 2 via ORAL
  Filled 2018-04-14 (×2): qty 2

## 2018-04-14 NOTE — Discharge Instructions (Signed)
Spinal Fusion, Adult, Care After This sheet gives you information about how to care for yourself after your procedure. Your health care provider may also give you more specific instructions. If you have problems or questions, contact your health care provider. What can I expect after the procedure? After the procedure, it is common to have:  Back pain and stiffness.  Pain in the incision area.  Follow these instructions at home: Medicines  Take over-the-counter and prescription medicines only as told by your health care provider. These include any pain medicines or blood thinning medicines (anticoagulants).  If you were prescribed an antibiotic medicine, take it as told by your health care provider. Do not stop taking the antibiotic even if you start to feel better.  Do not drive for 24 hours if you received a medicine to help you relax (sedative).  Do not drive or use heavy machinery while taking prescription pain medicine. If you have a brace:  Wear the brace as told by your health care provider. Remove it only as told by your health care provider.  Keep the brace clean. Managing pain, stiffness, and swelling  If directed, put ice on the injured area: ? If you have a removable brace, remove it as told by your health care provider. ? Put ice in a plastic bag. ? Place a towel between your skin and the bag. ? Leave the ice on for 20 minutes, 2-3 times a day. Incision care  Follow instructions from your health care provider about how to take care of your incision. Make sure you: ? Wash your hands with soap and water before you change your bandage (dressing). If soap and water are not available, use hand sanitizer. ? Change your dressing as told by your health care provider. ? Leave stitches (sutures), skin glue, or adhesive strips in place. These skin closures may need to be in place for 2 weeks or longer. If adhesive strip edges start to loosen and curl up, you may trim the loose  edges. Do not remove adhesive strips completely unless your health care provider tells you to do that.  Keep your incision clean and dry. Do not take baths, swim, or use a hot tub until your health care provider approves.  Check your incision area every day for signs of infection. Check for: ? More redness, swelling, or pain. ? Fluid or blood. ? Warmth. ? Pus or a bad smell. Physical activity  Rest and protect your back as much as possible.  Follow instructions from your health care provider about how to move and use good posture to help your spine heal.  Do not lift anything that is heavier than 8 lb (3.6 kg) or as told by your health care provider.  Do not twist or bend at the waist until your health care provider approves.  Avoid: ? Pushing and pulling motions. ? Lifting anything over your head. ? Sitting or lying down in the same position for long periods of time.  Do not exercise until your health care provider approves. Once your health care provider has approved exercise, ask him or her what kinds of exercises you can do to make your back stronger (physical therapy). General instructions  Wear compression stockings and walk at least every few hours as told by your health care provider. Doing this will help to prevent blood clots and reduce swelling in your legs.  Do not use any products that contain nicotine or tobacco, such as cigarettes and e-cigarettes. These can  delay bone healing. If you need help quitting, ask your health care provider.  To prevent or treat constipation while you are taking prescription pain medicine, your health care provider may recommend that you: ? Drink enough fluid to keep your urine clear or pale yellow. ? Take over-the-counter or prescription medicines. ? Eat foods that are high in fiber, such as fresh fruits and vegetables, whole grains, and beans. ? Limit foods that are high in fat and processed sugars, such as fried and sweet foods.  Keep  all follow-up visits as told by your health care provider. This is important. Contact a health care provider if:  You have pain that gets worse or does not get better with medicine.  Your legs or feet become painful or swollen.  You have more redness, swelling, or pain around your incision.  You have fluid or blood coming from your incision.  Your incision feels warm to the touch.  You have pus or a bad smell coming from your incision.  You have a fever.  You vomit or feel nausea.  You have weakness or numbness in your legs that is new or getting worse.  You have trouble controlling urination or bowel movements. Get help right away if:  You have severe pain.  You have chest pain.  You have trouble breathing.  You develop a cough. These symptoms may represent a serious problem that is an emergency. Do not wait to see if the symptoms will go away. Get medical help right away. Call your local emergency services (911 in the [place] Do not drive yourself to the hospital. Summary  It is common to have pain at the back and incision area.  Icing and pain medicines may help to control the pain. Follow directions from your health care provider.  Rest and protect your back as much as possible. Do not twist or bend at the waist. Get up and walk at least every few hours as told by your health care provider. This information is not intended to replace advice given to you by your health care provider. Make sure you discuss any questions you have with your health care provider. Document Released: 05/14/2005 Document Revised: 10/13/2016 Document Reviewed: 10/13/2016 Elsevier Interactive Patient Education  [employer].

## 2018-04-14 NOTE — Progress Notes (Signed)
Patient alert and oriented, mae's well, voiding adequate amount of urine, swallowing without difficulty, no c/o pain at time of discharge. Patient discharged home with family. Script and discharged instructions given to patient. Patient and family stated understanding of instructions given. Patient has an appointment with Dr. Jones °

## 2018-04-14 NOTE — Evaluation (Signed)
Occupational Therapy Evaluation Patient Details Name: Bianca Pollard MRN: [id number] DOB: 12-05-1945 Today's Date: 04/14/2018    History of Present Illness Pt is a 72 y/o female s/p L4-5 PLIF. PMH includes osteopenia and R ORIF of radial fx.    Clinical Impression   PTA patient was living alone and was independent with ADL, IADL and functional mobility.  After L4-5 PLIF, patient presents with back precautions and lumbar corset; she is able to adhere to precautions and don brace with independence.  Patient currently requires supervision for shower and toilet transfers (for safety), completes LB dressing sitting/standing with supervision and UB dressing with modified independence.  Therapist reviewed back precautions and impact to daily activities, safety, and DME/AE options for increased safety, energy conservation, and adherence to precautions.  She demonstrates good safety awareness and declined recommendation for 3 in 1 commode.  She will have assistance from neighbors at discharge as needed.  Patient has received all OT education and verbalizes understanding.  OT signing off.     Follow Up Recommendations  No OT follow up;Follow surgeon's recommendation for DC plan and follow-up therapies;Supervision - Intermittent    Equipment Recommendations  3 in 1 bedside commode    Recommendations for Other Services       Precautions / Restrictions Precautions Precautions: Back Precaution Booklet Issued: Yes (comment)(previously provided by PT ) Precaution Comments: Reviewed back precautions with pt, able to recall 3/3 with independence Required Braces or Orthoses: Spinal Brace Spinal Brace: Lumbar corset;Applied in standing position(donned independently) Restrictions Weight Bearing Restrictions: No      Mobility Bed Mobility               General bed mobility comments: patient seated EOB upon therapist entering room  Transfers Overall transfer level: Needs assistance Equipment  used: None Transfers: Sit to/from Stand Sit to Stand: Supervision         General transfer comment: Supervision for safety.     Balance Overall balance assessment: No apparent balance deficits (not formally assessed)                                         ADL either performed or assessed with clinical judgement   ADL Overall ADL's : Needs assistance/impaired Eating/Feeding: Independent   Grooming: Modified independent;Standing Grooming Details (indicate cue type and reason): good carryover of techniques discussed, squatting at sink to wash hands and simulate brushing teeth  Upper Body Bathing: Modified independent   Lower Body Bathing: Supervison/ safety;Sit to/from stand;Adhering to back precautions Lower Body Bathing Details (indicate cue type and reason): educated on long sponge for LB bathing ease, seated for safety  Upper Body Dressing : Modified independent Upper Body Dressing Details (indicate cue type and reason): donning brace with independence  Lower Body Dressing: Supervision/safety Lower Body Dressing Details (indicate cue type and reason): educated on techniques, no pain with crosslegged technique and completion seated to thread legs/don shoes  Toilet Transfer: BSC;Regular Toilet;Supervision/safety Toilet Transfer Details (indicate cue type and reason): 3in1 over toilet, reports similar height to commode at home (has sink counter next to toilet at home)  [place] and Hygiene: Sit to/from stand;Modified independent Toileting - Clothing Manipulation Details (indicate cue type and reason): discussed and demonstrated toileting techniques to avoid twisting Tub/ Shower Transfer: Supervision/safety;Cueing for safety;Adhering to back precautions Tub/Shower Transfer Details (indicate cue type and reason): simulated shower transfer in room, stepping  over threshold sideways  Functional mobility during ADLs: Independent General ADL  Comments: patients friend present.  patient demonstrating good ahderance to precautions during func mobility and self care tasks with no cueing; good recall of education with demonstrating tasks.      Vision Baseline Vision/History: Wears glasses Wears Glasses: At all times Patient Visual Report: No change from baseline       Perception     Praxis      Pertinent Vitals/Pain Pain Assessment: Faces Faces Pain Scale: Hurts a little bit Pain Descriptors / Indicators: Aching;Operative site guarding Pain Intervention(s): Monitored during session     Hand Dominance     Extremity/Trunk Assessment Upper Extremity Assessment Upper Extremity Assessment: Overall WFL for tasks assessed   Lower Extremity Assessment Lower Extremity Assessment: Defer to PT evaluation   Cervical / Trunk Assessment Cervical / Trunk Assessment: Other exceptions Cervical / Trunk Exceptions: s/p PLIF    Communication Communication Communication: No difficulties   Cognition Arousal/Alertness: Awake/alert Behavior During Therapy: WFL for tasks assessed/performed Overall Cognitive Status: Within Functional Limits for tasks assessed                                 General Comments: good recall of precautions and techniques to adhere to precautions during daily activities    General Comments       Exercises     Shoulder Instructions      Home Living Family/patient expects to be discharged to:: Private residence Living Arrangements: Alone Available Help at Discharge: Friend(s);Available 24 hours/day Type of Home: House Home Access: Stairs to enter [employer] of Steps: 1(porch step) Entrance Stairs-Rails: None Home Layout: One level     Bathroom Shower/Tub: Occupational psychologist: Handicapped height     Home Equipment: None          Prior Functioning/Environment Level of Independence: Independent        Comments: with ADL, IADL and mobility          OT Problem List:        OT Treatment/Interventions:      OT Goals(Current goals can be found in the care plan section) Acute Rehab OT Goals Patient Stated Goal: go home today  OT Goal Formulation: With patient  OT Frequency:     Barriers to D/C:            Co-evaluation              AM-PAC PT "6 Clicks" Daily Activity     Outcome Measure Help from another person eating meals?: None Help from another person taking care of personal grooming?: None Help from another person toileting, which includes using toliet, bedpan, or urinal?: A Little Help from another person bathing (including washing, rinsing, drying)?: A Little Help from another person to put on and taking off regular upper body clothing?: None Help from another person to put on and taking off regular lower body clothing?: A Little 6 Click Score: 21   End of Session Equipment Utilized During Treatment: Back brace  Activity Tolerance: Patient tolerated treatment well Patient left: Other (comment);with family/visitor present(seated at EOB)  OT Visit Diagnosis: Other abnormalities of gait and mobility (R26.89)                Time: 8250-5397 OT Time Calculation (min): 12 min Charges:  OT General Charges $OT Visit: 1 Visit OT Evaluation $OT Eval Low Complexity: 1 Low  G-Codes:     Delight Stare, OTR/L  Pager Ali Molina 04/14/2018, 11:00 AM

## 2018-04-14 NOTE — Discharge Summary (Signed)
Physician Discharge Summary  Patient ID: Bianca Pollard MRN: [id number] DOB/AGE: 72-Jun-1947 72 y.o.  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 The patient tolerated the procedure well and was taken to the recovery room and then to the floor in stable condition. The hospital course was routine. There were no complications. The wound remained clean dry and intact. Pt had appropriate back soreness. No complaints of leg pain or new N/T/W. The patient remained afebrile with stable vital signs, and tolerated a regular diet. The patient continued to increase activities, and pain was well controlled with oral pain medications.   Consults: None  Significant Diagnostic Studies:  Results for orders placed or performed during the hospital encounter of 04/13/18  Protime-INR  Result Value Ref Range   Prothrombin Time 13.0 11.4 - 15.2 seconds   INR 0.99     Chest 2 View  Result Date: 04/13/2018 CLINICAL DATA:  Pre-op respiratory exam for lumbar spine fusion. EXAM: CHEST - 2 VIEW COMPARISON:  09/16/2006 FINDINGS: The heart size and mediastinal contours are within normal limits. Aortic atherosclerosis. Both lungs are clear. The visualized skeletal structures are unremarkable. IMPRESSION: No active cardiopulmonary disease. Electronically Signed   By: Earle Gell M.D.   On: 04/13/2018 07:21   Dg Lumbar Spine 2-3 Views  Result Date: 04/13/2018 CLINICAL DATA:  Posterior lumbar interbody fusion. FLUOROSCOPY TIME:  43 seconds. EXAM: LUMBAR SPINE - 2-3 VIEW; DG C-ARM 61-120 MIN COMPARISON:  None. FINDINGS: The patient is status post L4-5 interbody fusion. Hardware is in good position. IMPRESSION: L4-5 interbody fusion.  Hardware is in good position. Electronically  Signed   By: Dorise Bullion III M.D   On: 04/13/2018 10:17   Dg C-arm 1-60 Min  Result Date: 04/13/2018 CLINICAL DATA:  Posterior lumbar interbody fusion. FLUOROSCOPY TIME:  43 seconds. EXAM: LUMBAR SPINE - 2-3 VIEW; DG C-ARM 61-120 MIN COMPARISON:  None. FINDINGS: The patient is status post L4-5 interbody fusion. Hardware is in good position. IMPRESSION: L4-5 interbody fusion.  Hardware is in good position. Electronically Signed   By: Dorise Bullion III M.D   On: 04/13/2018 10:17    Antibiotics:  Anti-infectives (From admission, onward)   Start     Dose/Rate Route Frequency Ordered Stop   04/13/18 1600  ceFAZolin (ANCEF) IVPB 2g/100 mL premix     2 g 200 mL/hr over 30 Minutes Intravenous Every 8 hours 04/13/18 1250 04/14/18 0000   04/13/18 0824  vancomycin (VANCOCIN) powder  Status:  Discontinued       As needed 04/13/18 0824 04/13/18 1014   04/13/18 0710  bacitracin 50,000 Units in sodium chloride 0.9 % 500 mL irrigation  Status:  Discontinued       As needed 04/13/18 0820 04/13/18 1014   04/13/18 0630  ceFAZolin (ANCEF) IVPB 2g/100 mL premix     2 g 200 mL/hr over 30 Minutes Intravenous On call to O.R. 04/13/18 [id number] 04/13/18 0747      Discharge Exam: Blood pressure 129/68, pulse 66, temperature 98.6 F (37 C), temperature source Oral, resp. rate 16, weight 59.4 kg (131 lb), SpO2 100 %. Neurologic: Grossly normal Dressing dry  Discharge Medications:   Allergies as of 04/14/2018      Reactions   Sulfa Antibiotics Other (See Comments)   UNSPECIFIED REACTION  Childhood allergy    Nsaids Other (See Comments)   One kidney      Medication List    TAKE these medications   acetaminophen 325 MG tablet Commonly known as:  TYLENOL Take 650 mg by mouth every 6 (six) hours as needed.   CALCIUM + D PO Take 1 tablet by mouth 2 (two) times daily.   cetirizine 10 MG tablet Commonly known as:  ZYRTEC Take 10 mg by mouth every morning.   fluticasone 50 MCG/ACT nasal spray Commonly  known as:  FLONASE Place 1 spray into both nostrils daily.   HYDROcodone-acetaminophen 5-325 MG tablet Commonly known as:  NORCO/VICODIN Take 1-2 tablets by mouth every 4 (four) hours as needed for moderate pain.   methocarbamol 500 MG tablet Commonly known as:  ROBAXIN Take 1 tablet (500 mg total) by mouth every 6 (six) hours as needed for muscle spasms.   PRESERVISION AREDS 2 PO Take 1 capsule by mouth 2 (two) times daily.   traZODone 150 MG tablet Commonly known as:  DESYREL Take 150 mg by mouth at bedtime.            Durable Medical Equipment  (From admission, onward)        Start     Ordered   04/13/18 1251  DME Walker rolling  Once    Question:  Patient needs a walker to treat with the following condition  Answer:  S/P lumbar fusion   04/13/18 1250   04/13/18 1251  DME 3 n 1  Once     04/13/18 1250      Disposition: home   Final Dx: PLIF L4-5  Discharge Instructions     Remove dressing in 72 hours   Complete by:  As directed    Call MD for:  difficulty breathing, headache or visual disturbances   Complete by:  As directed    Call MD for:  persistant nausea and vomiting   Complete by:  As directed    Call MD for:  redness, tenderness, or signs of infection (pain, swelling, redness, odor or green/yellow discharge around incision site)   Complete by:  As directed    Call MD for:  severe uncontrolled pain   Complete by:  As directed    Call MD for:  temperature >100.4   Complete by:  As directed    Diet - low sodium heart healthy   Complete by:  As directed    Increase activity slowly   Complete by:  As directed          Signed: Viann Nielson S 04/14/2018, 7:33 AM

## 2018-05-01 ENCOUNTER — Other Ambulatory Visit: Payer: Self-pay | Admitting: Family Medicine

## 2018-05-01 DIAGNOSIS — Z1231 Encounter for screening mammogram for malignant neoplasm of breast: Secondary | ICD-10-CM

## 2018-06-05 ENCOUNTER — Ambulatory Visit: Payer: Medicare Other

## 2018-06-06 ENCOUNTER — Ambulatory Visit: Payer: Medicare Other

## 2018-06-09 ENCOUNTER — Ambulatory Visit
Admission: RE | Admit: 2018-06-09 | Discharge: 2018-06-09 | Disposition: A | Discharge status: Home / Self Care | Payer: Medicare Other | Source: Ambulatory Visit | Attending: Family Medicine | Admitting: Family Medicine

## 2018-06-09 DIAGNOSIS — Z1231 Encounter for screening mammogram for malignant neoplasm of breast: Secondary | ICD-10-CM

## 2018-12-11 ENCOUNTER — Other Ambulatory Visit: Payer: Self-pay | Admitting: Family Medicine

## 2018-12-11 DIAGNOSIS — L989 Disorder of the skin and subcutaneous tissue, unspecified: Secondary | ICD-10-CM

## 2018-12-13 ENCOUNTER — Other Ambulatory Visit: Payer: Self-pay | Admitting: Family Medicine

## 2018-12-13 ENCOUNTER — Ambulatory Visit
Admission: RE | Admit: 2018-12-13 | Discharge: 2018-12-13 | Disposition: A | Discharge status: Home / Self Care | Payer: Medicare Other | Source: Ambulatory Visit | Attending: Family Medicine | Admitting: Family Medicine

## 2018-12-13 DIAGNOSIS — L989 Disorder of the skin and subcutaneous tissue, unspecified: Secondary | ICD-10-CM

## 2019-01-23 ENCOUNTER — Emergency Department (HOSPITAL_BASED_OUTPATIENT_CLINIC_OR_DEPARTMENT_OTHER): Payer: Medicare Other

## 2019-01-23 ENCOUNTER — Emergency Department (HOSPITAL_BASED_OUTPATIENT_CLINIC_OR_DEPARTMENT_OTHER)
Admission: EM | Admit: 2019-01-23 | Discharge: 2019-01-23 | Disposition: A | Discharge status: Home / Self Care | Payer: Medicare Other | Attending: Emergency Medicine | Admitting: Emergency Medicine

## 2019-01-23 ENCOUNTER — Other Ambulatory Visit: Payer: Self-pay

## 2019-01-23 ENCOUNTER — Encounter (HOSPITAL_BASED_OUTPATIENT_CLINIC_OR_DEPARTMENT_OTHER): Payer: Self-pay | Admitting: Emergency Medicine

## 2019-01-23 DIAGNOSIS — B9789 Other viral agents as the cause of diseases classified elsewhere: Secondary | ICD-10-CM | POA: Diagnosis not present

## 2019-01-23 DIAGNOSIS — R05 Cough: Secondary | ICD-10-CM | POA: Diagnosis present

## 2019-01-23 DIAGNOSIS — Z79899 Other long term (current) drug therapy: Secondary | ICD-10-CM | POA: Insufficient documentation

## 2019-01-23 DIAGNOSIS — Z87891 Personal history of nicotine dependence: Secondary | ICD-10-CM | POA: Insufficient documentation

## 2019-01-23 DIAGNOSIS — J069 Acute upper respiratory infection, unspecified: Secondary | ICD-10-CM | POA: Insufficient documentation

## 2019-01-23 DIAGNOSIS — Z20828 Contact with and (suspected) exposure to other viral communicable diseases: Secondary | ICD-10-CM | POA: Insufficient documentation

## 2019-01-23 LAB — INFLUENZA PANEL BY PCR (TYPE A & B)
INFLAPCR: NEGATIVE
INFLBPCR: NEGATIVE

## 2019-01-23 NOTE — Discharge Instructions (Addendum)
Your xray today was clear. You  may continue with the medication prescribed by your PCP.  You were also tested for influenza and Covid 19 you may check your results at the end of the week on my chart. You are required to stay home for the next 14 days.

## 2019-01-23 NOTE — ED Provider Notes (Signed)
[facility]PART[facility] Provider Note   CSN: [id number] Arrival date & time: 01/23/19  1302    History   Chief Complaint Chief Complaint  Patient presents with  . Cough    HPI Bianca Pollard is a 73 y.o. female.     73 y.o female with a PMH of renal insufficiency, anxiety presents to the ED with a chief complaint of cough x 1 week. Patient reports she was seen by her PCP one week and was given a prescription for a azithromycin which she completed without relief.  She reports calling her PCP who advised her to be seen in the ED and tested for coronavirus.  Patient has no travel activity, no recent exposure to any known source.  Patient reports she was also prescribed codeine syrup, tessalon pearls and was switched to a stronger antibiotic yesterday.  Patient states her cough is worse with laying down flat.  She denies any chest pain, shortness of breath, other complaints at this time.     Past Medical History:  Diagnosis Date  . Anxiety   . Arthritis    thumb and wrist left  . Complication of anesthesia   . Fibrocystic breast disease   . Fibroid uterus   . Hemorrhoid   . Mental disorder   . Osteopenia   . PONV (postoperative nausea and vomiting)   . Renal insufficiency     Patient Active Problem List   Diagnosis Date Noted  . S/P lumbar spinal fusion 04/13/2018  . Incisional hernia, without obstruction or gangrene 03/01/2011    Past Surgical History:  Procedure Laterality Date  . BREAST BIOPSY Bilateral 20+ yrs ago   benign  . CESAREAN SECTION  1980  . CHEILECTOMY  10/2004   left great toe  . [place] REPAIR  03/16/2004  . [place]  2012  . LIPOSUCTION  2012  . 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 OPEN REDUCTION INTERNAL FIXATION (ORIF) DISTAL RADIAL FRACTURE Right 07/05/2017   Procedure: OPEN TREATMENT OF RIGHT DISTAL RADIUS FRACTURE ;  Surgeon: Milly Jakob, MD;  Location: [facility];  Service:  Orthopedics;  Laterality: Right;  . TONSILLECTOMY    . VENTRAL HERNIA REPAIR N/A 04/09/2013   Procedure: REPAIR RECURRENT EPIGASTRIC HERNIA  with mesh;  Surgeon: Haywood Lasso, MD;  Location: [facility];  Service: General;  Laterality: N/A;     OB History   No obstetric history on file.      Home Medications    Prior to Admission medications   Medication Sig Start Date End Date Taking? Authorizing Provider  acetaminophen (TYLENOL) 325 MG tablet Take 650 mg by mouth every 6 (six) hours as needed.     [provider]  Calcium Carbonate-Vitamin D (CALCIUM + D PO) Take 1 tablet by mouth 2 (two) times daily.     [provider]  cetirizine (ZYRTEC) 10 MG tablet Take 10 mg by mouth every morning.    [provider]  fluticasone (FLONASE) 50 MCG/ACT nasal spray Place 1 spray into both nostrils daily.    [provider]  HYDROcodone-acetaminophen (NORCO/VICODIN) 5-325 MG tablet Take 1-2 tablets by mouth every 4 (four) hours as needed for moderate pain. 04/14/18   Eustace Moore, MD  methocarbamol (ROBAXIN) 500 MG tablet Take 1 tablet (500 mg total) by mouth every 6 (six) hours as needed for muscle spasms. 04/14/18   Eustace Moore, MD  Multiple Vitamins-Minerals (PRESERVISION AREDS 2 PO)  Take 1 capsule by mouth 2 (two) times daily.    [provider]  traZODone (DESYREL) 150 MG tablet Take 150 mg by mouth at bedtime.    [provider]    Family History Family History  Problem Relation Age of Onset  . Cancer Maternal Grandmother        Breast  . Cancer Paternal Grandfather        Breast    Social History Social History   Tobacco Use  . Smoking status: Former Smoker    Last attempt to quit: 11/08/1974    Years since quitting: 44.2  . Smokeless tobacco: Never Used  Substance Use Topics  . Alcohol use: Yes    Alcohol/week: 4.0 standard drinks    Types: 4 Glasses of wine per week    Comment: Weekly.  . Drug use: No     Allergies    Sulfa antibiotics and Nsaids   Review of Systems Review of Systems  Constitutional: Negative for chills and fever.  HENT: Negative for ear pain and sore throat.   Eyes: Negative for pain and visual disturbance.  Respiratory: Positive for cough. Negative for shortness of breath.   Cardiovascular: Negative for chest pain and palpitations.  Gastrointestinal: Negative for abdominal pain and vomiting.  Genitourinary: Negative for dysuria and hematuria.  Musculoskeletal: Negative for arthralgias and back pain.  Skin: Negative for color change and rash.  Neurological: Negative for seizures and syncope.  All other systems reviewed and are negative.    Physical Exam Updated Vital Signs BP (!) 149/79 (BP Location: Left Arm)   Pulse 95   Temp 98.7 F (37.1 C) (Oral)   Resp 18   Ht 5\' 4"  (1.626 m)   Wt 59.9 kg   SpO2 100%   BMI 22.66 kg/m   Physical Exam Vitals signs and nursing note reviewed.  Constitutional:      General: She is not in acute distress.    Appearance: She is well-developed.  HENT:     Head: Normocephalic and atraumatic.     Mouth/Throat:     Pharynx: No oropharyngeal exudate.  Eyes:     Pupils: Pupils are equal, round, and reactive to light.  Neck:     Musculoskeletal: Normal range of motion.  Cardiovascular:     Rate and Rhythm: Regular rhythm.     Heart sounds: Normal heart sounds.  Pulmonary:     Effort: Pulmonary effort is normal. No respiratory distress.     Breath sounds: Rhonchi present. No wheezing.  Abdominal:     General: Bowel sounds are normal. There is no distension.     Palpations: Abdomen is soft.     Tenderness: There is no abdominal tenderness.  Musculoskeletal:        General: No tenderness or deformity.     Right lower leg: No edema.     Left lower leg: No edema.  Skin:    General: Skin is warm and dry.  Neurological:     Mental Status: She is alert and oriented to person, place, and time.      ED Treatments / Results  Labs  (all labs ordered are listed, but only abnormal results are displayed) Labs Reviewed  NOVEL CORONAVIRUS, NAA (HOSPITAL ORDER, SEND-OUT TO REF LAB)    EKG None  Radiology Dg Chest 2 View  Result Date: 01/23/2019 CLINICAL DATA:  Cough and fever EXAM: CHEST - 2 VIEW COMPARISON:  April 13, 2018 FINDINGS: There is no appreciable edema or  consolidation. Size and pulmonary vascularity are normal. No adenopathy. There is aortic atherosclerosis. No bone lesions. There is postoperative change in the lumbar spine, incompletely visualized. IMPRESSION: No edema or consolidation. Aortic Atherosclerosis (ICD10-I70.0). Electronically Signed   By: Lowella Grip III M.D.   On: 01/23/2019 13:56    Procedures Procedures (including critical care time)  Medications Ordered in ED Medications - No data to display   Initial Impression / Assessment and Plan / ED Course  I have reviewed the triage vital signs and the nursing notes.  Pertinent labs & imaging results that were available during my care of the patient were reviewed by me and considered in my medical decision making (see chart for details).       Patient presents with URI symptoms, reports a cough for the past week given a Cipro by her PCP with no improvement in symptoms.  Vital signs are stable, no hypoxia.  During evaluation patient has a consistent dry cough.  She was given Tessalon Pearls, codeine syrup, different antibiotics.   Patient reports she was instructed by her PCP to be seen in the ED for her symptoms and tested for COVID 19, she was advised to be tested in the emergency department.  Test was performed in the emergency department, she is advised to check her results in the next coming weeks.  Patient shows and agrees with management.  Return precautions provided at length.   Final Clinical Impressions(s) / ED Diagnoses   Final diagnoses:  Viral URI with cough    ED Discharge Orders    None       Janeece Fitting, Hershal Coria  01/23/19 [place], MD 01/24/19 [phone]

## 2019-01-23 NOTE — ED Notes (Signed)
Pt verbalizes understanding of d/c instructions and denies any further needs at this time. 

## 2019-01-23 NOTE — ED Triage Notes (Addendum)
Reports cough and fever x 1 week.  Given z-pack without relief.  Sent by PCP to have coronavirus testing but patient states PCP was unable to get scheduled so PCP sent patient to ER.  Reports cough as nonproductive.

## 2019-01-27 LAB — NOVEL CORONAVIRUS, NAA (HOSPITAL ORDER, SEND-OUT TO REF LAB): SARS-COV-2, NAA: NOT DETECTED

## 2019-01-27 LAB — NOVEL CORONAVIRUS, NAA (HOSP ORDER, SEND-OUT TO REF LAB; TAT 18-24 HRS)

## 2019-04-26 ENCOUNTER — Other Ambulatory Visit: Payer: Self-pay | Admitting: Family Medicine

## 2019-04-26 DIAGNOSIS — Z1231 Encounter for screening mammogram for malignant neoplasm of breast: Secondary | ICD-10-CM

## 2019-06-14 ENCOUNTER — Ambulatory Visit
Admission: RE | Admit: 2019-06-14 | Discharge: 2019-06-14 | Disposition: A | Discharge status: Home / Self Care | Payer: Medicare Other | Source: Ambulatory Visit | Attending: Family Medicine | Admitting: Family Medicine

## 2019-06-14 ENCOUNTER — Other Ambulatory Visit: Payer: Self-pay

## 2019-06-14 DIAGNOSIS — Z1231 Encounter for screening mammogram for malignant neoplasm of breast: Secondary | ICD-10-CM

## 2019-12-05 ENCOUNTER — Ambulatory Visit: Payer: Medicare Other

## 2019-12-14 ENCOUNTER — Ambulatory Visit: Payer: Medicare Other | Attending: Internal Medicine

## 2019-12-14 DIAGNOSIS — Z23 Encounter for immunization: Secondary | ICD-10-CM | POA: Insufficient documentation

## 2019-12-14 NOTE — Progress Notes (Signed)
   Covid-19 Vaccination Clinic  Name:  Bianca Pollard    MRN: [id number] DOB: 06-Feb-1946  12/14/2019  [name] was observed post Covid-19 immunization for 15 minutes without incidence. She was provided with Vaccine Information Sheet and instruction to access the V-Safe system.   [name] was instructed to call 911 with any severe reactions post vaccine: Marland Kitchen Difficulty breathing  . Swelling of your face and throat  . A fast heartbeat  . A bad rash all over your body  . Dizziness and weakness    Immunizations Administered    Name Date Dose VIS Date Route   Pfizer COVID-19 Vaccine 12/14/2019  8:26 AM 0.3 mL 10/19/2019 Intramuscular   Manufacturer: [place]   Lot: [id number]   [place]: [id number]

## 2019-12-22 ENCOUNTER — Ambulatory Visit: Payer: Medicare Other

## 2020-01-08 ENCOUNTER — Ambulatory Visit: Payer: Medicare Other | Attending: Internal Medicine

## 2020-01-08 DIAGNOSIS — Z23 Encounter for immunization: Secondary | ICD-10-CM | POA: Insufficient documentation

## 2020-01-08 NOTE — Progress Notes (Signed)
   Covid-19 Vaccination Clinic  Name:  Bianca Pollard    MRN: [id number] DOB: 12-26-1945  01/08/2020  [name] was observed post Covid-19 immunization for 15 minutes without incident. She was provided with Vaccine Information Sheet and instruction to access the V-Safe system.   [name] was instructed to call 911 with any severe reactions post vaccine: Marland Kitchen Difficulty breathing  . Swelling of face and throat  . A fast heartbeat  . A bad rash all over body  . Dizziness and weakness   Immunizations Administered    Name Date Dose VIS Date Route   Pfizer COVID-19 Vaccine 01/08/2020  8:47 AM 0.3 mL 10/19/2019 Intramuscular   Manufacturer: [place]   Lot: [id number]   [place]: [id number]

## 2020-03-17 IMAGING — MG DIGITAL SCREENING BILATERAL MAMMOGRAM WITH TOMO AND CAD
8 series · 9 of 24 positions shown · non-contrast
Comparison: Previous exam(s).

CLINICAL DATA: Screening.

EXAM:
DIGITAL SCREENING BILATERAL MAMMOGRAM WITH TOMO AND CAD

[L MLO synth-2D]
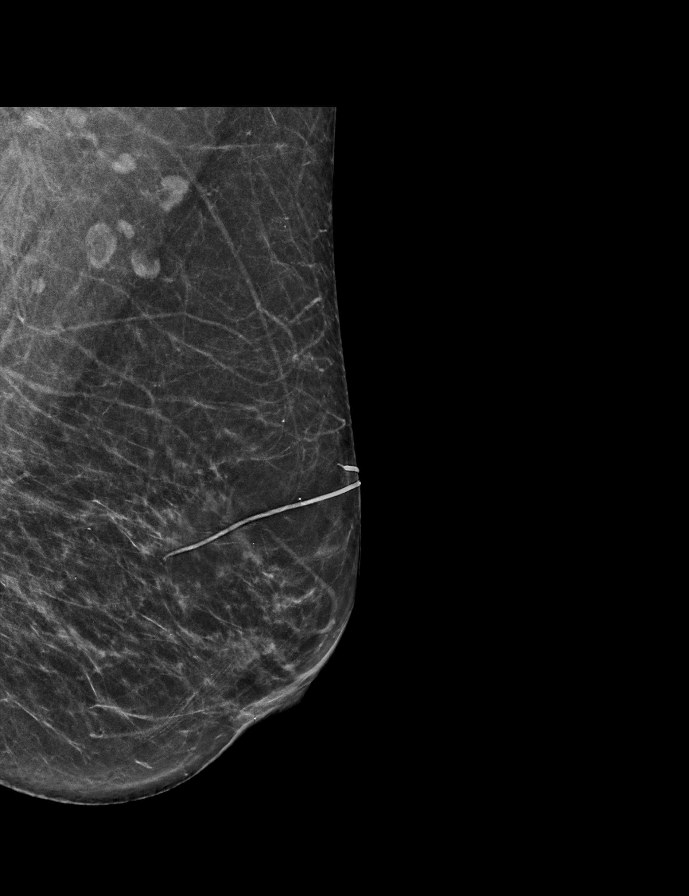

[L CC synth-2D]
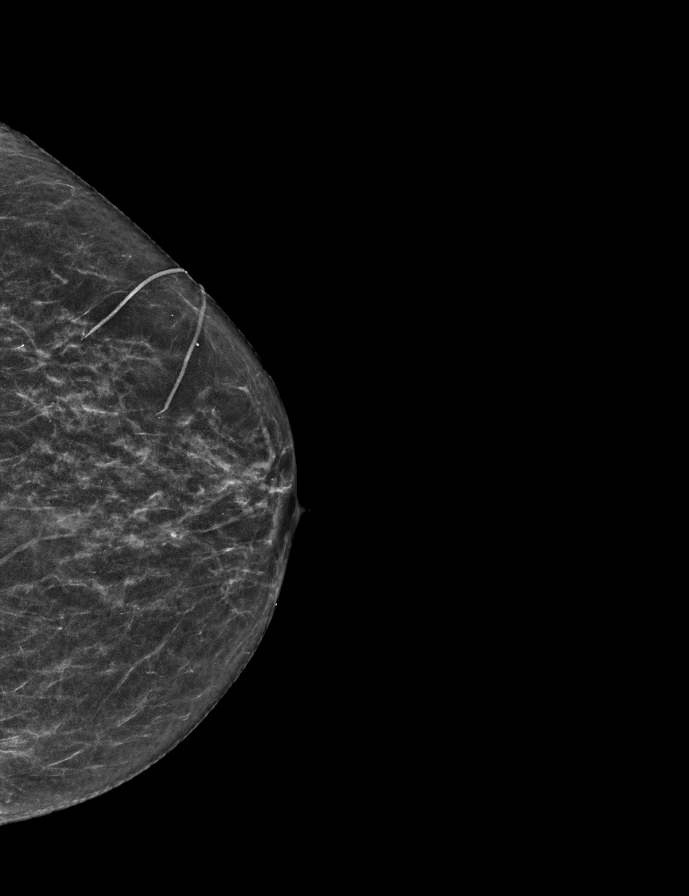

[R CC synth-2D]
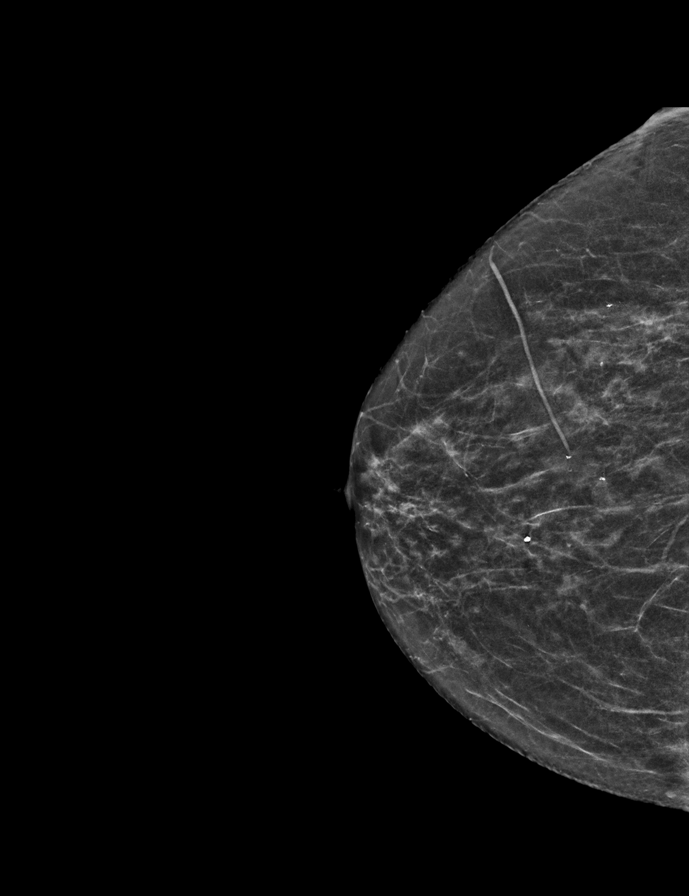

[R MLO synth-2D]
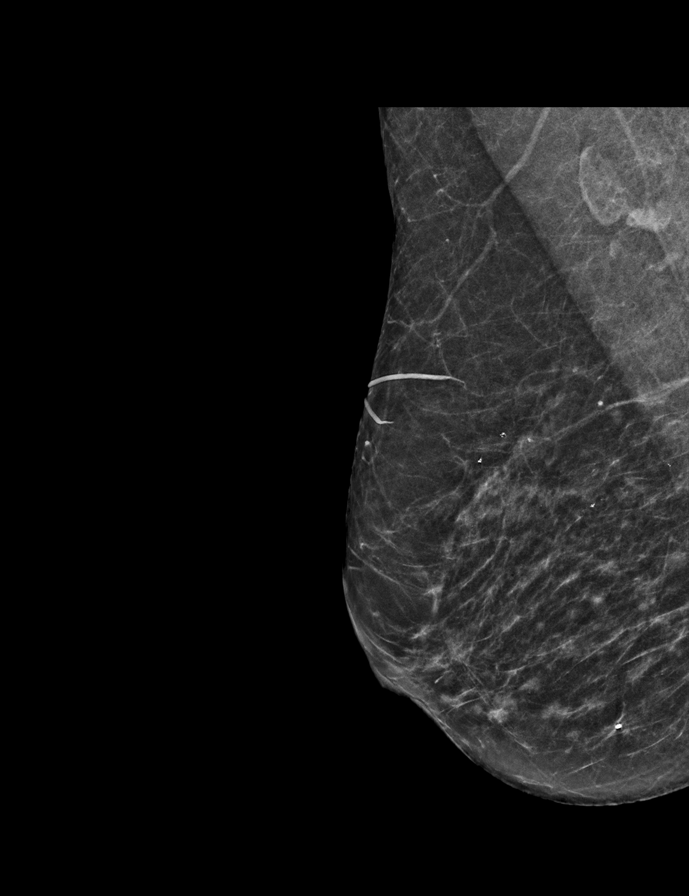

[R CC tomo · 2 of 46 frames shown]
[frame 15/46]
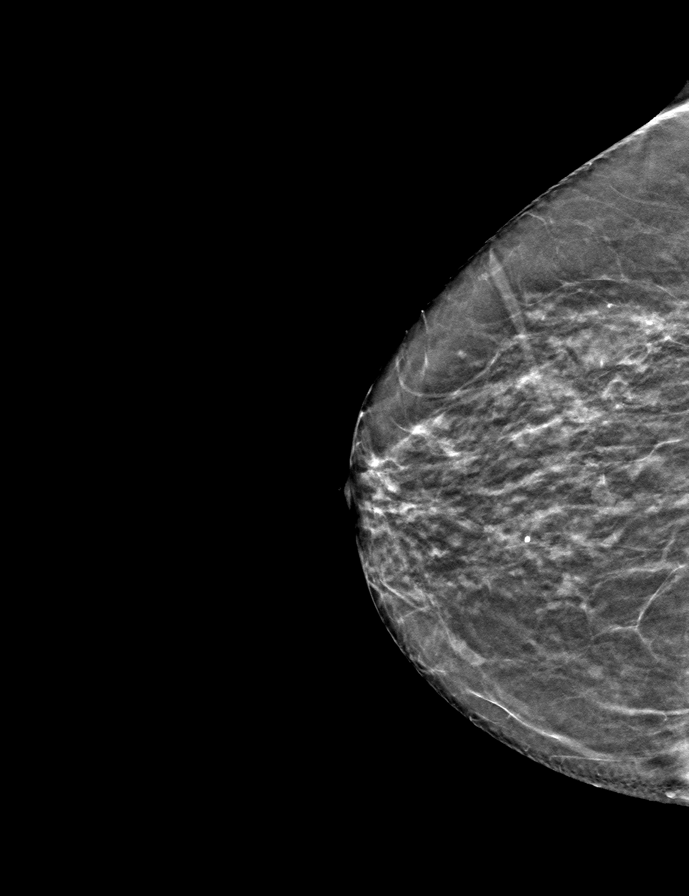
[frame 23/46]
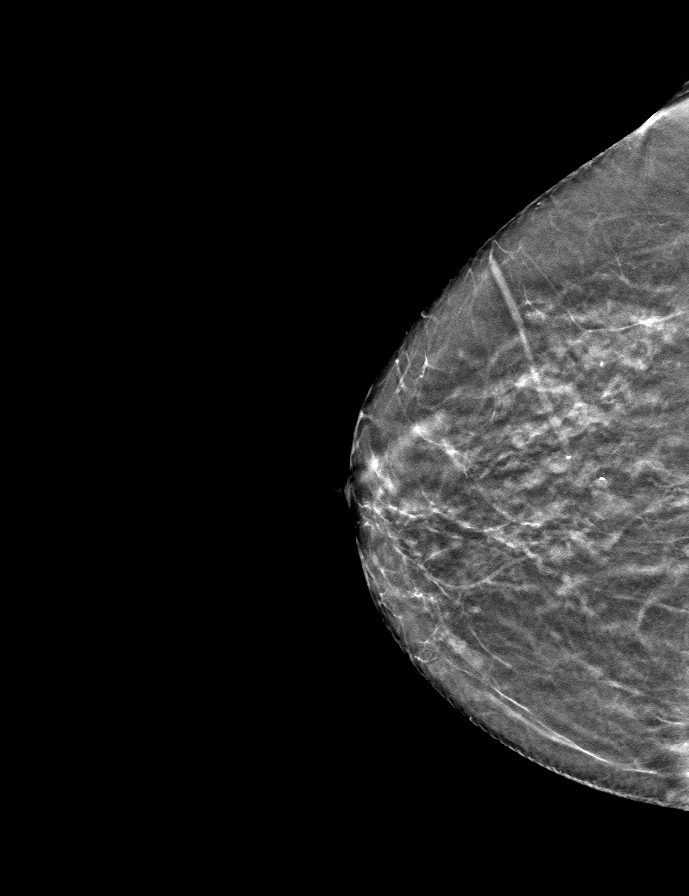

[L CC tomo · tomo slice 23/46.0]
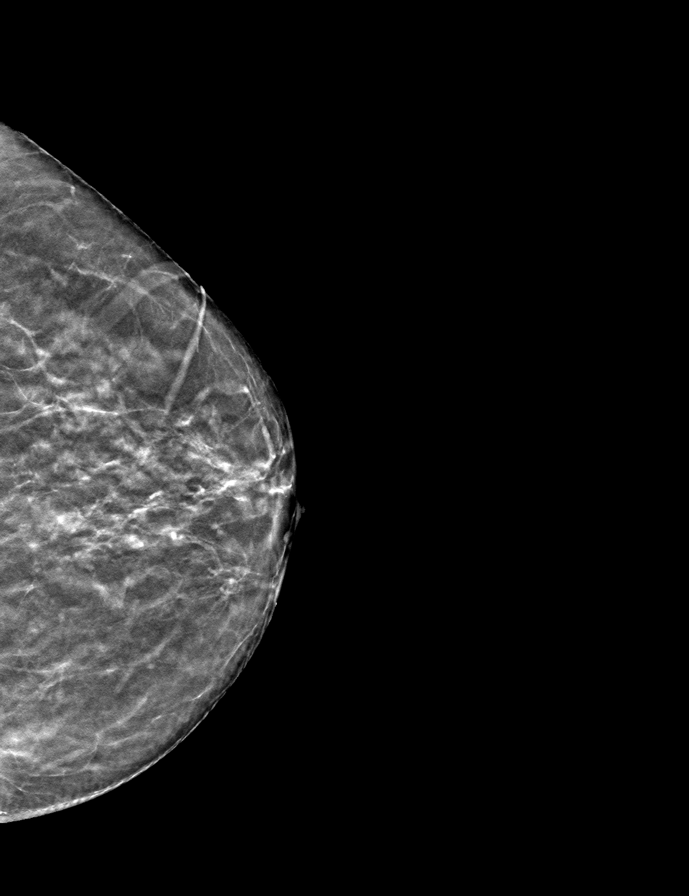

[L MLO tomo · tomo slice 27/54.0]
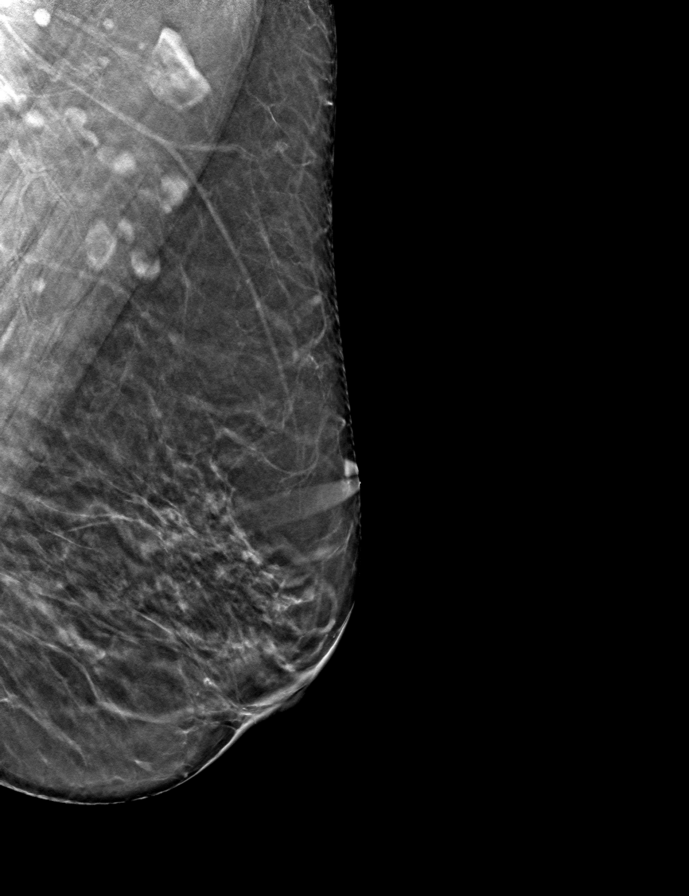

[R MLO tomo · tomo slice 25/49.0]
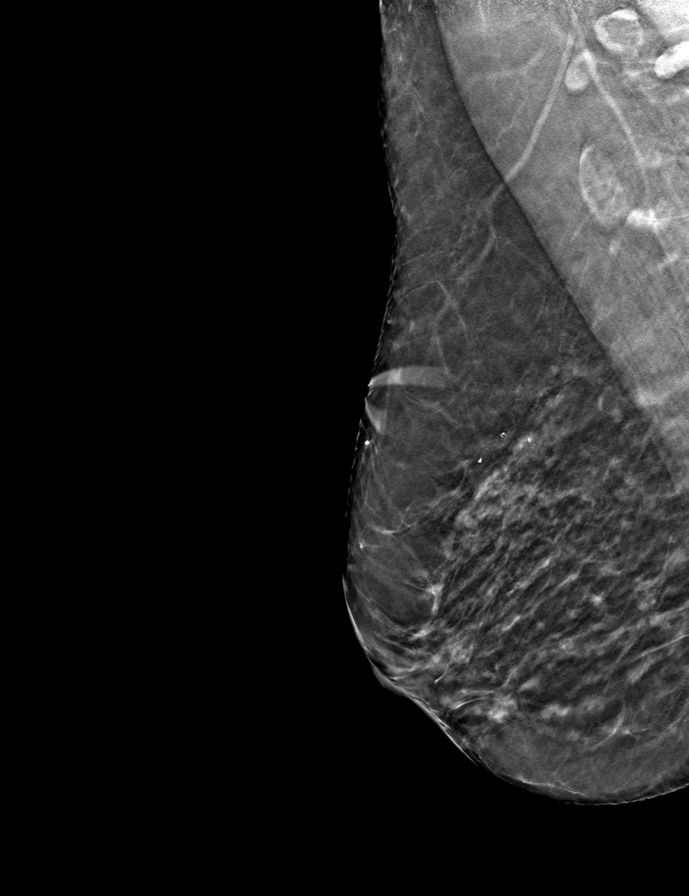

[9 of 24 positions shown; findings below may reference images not displayed]

ACR Breast Density Category b: There are scattered areas of
fibroglandular density.
FINDINGS: There are no findings suspicious for malignancy. Images were
processed with CAD.
IMPRESSION: No mammographic evidence of malignancy. A result letter of this
screening mammogram will be mailed directly to the patient.

RECOMMENDATION:
Screening mammogram in one year. (Code:[id number])

BI-RADS CATEGORY  1: Negative.

## 2020-04-30 ENCOUNTER — Other Ambulatory Visit: Payer: Self-pay | Admitting: Family Medicine

## 2020-04-30 DIAGNOSIS — Z1231 Encounter for screening mammogram for malignant neoplasm of breast: Secondary | ICD-10-CM

## 2020-06-24 ENCOUNTER — Other Ambulatory Visit: Payer: Self-pay

## 2020-06-24 ENCOUNTER — Ambulatory Visit
Admission: RE | Admit: 2020-06-24 | Discharge: 2020-06-24 | Disposition: A | Discharge status: Home / Self Care | Payer: Medicare Other | Source: Ambulatory Visit | Attending: Family Medicine | Admitting: Family Medicine

## 2020-06-24 DIAGNOSIS — Z1231 Encounter for screening mammogram for malignant neoplasm of breast: Secondary | ICD-10-CM
